# Patient Record
Sex: Male | Born: 1985 | Race: White | Hispanic: No
Health system: Southern US, Academic
[De-identification: ages and names within clinical notes are randomized; demographics above are authoritative.]

## PROBLEM LIST (undated history)

## (undated) ENCOUNTER — Telehealth

## (undated) ENCOUNTER — Encounter

## (undated) ENCOUNTER — Ambulatory Visit: Payer: PRIVATE HEALTH INSURANCE

## (undated) ENCOUNTER — Ambulatory Visit: Payer: BLUE CROSS/BLUE SHIELD

## (undated) ENCOUNTER — Encounter: Attending: Rheumatology | Primary: Rheumatology

## (undated) DIAGNOSIS — E7800 Pure hypercholesterolemia, unspecified: Secondary | ICD-10-CM

## (undated) DIAGNOSIS — I10 Essential (primary) hypertension: Secondary | ICD-10-CM

## (undated) DIAGNOSIS — F419 Anxiety disorder, unspecified: Secondary | ICD-10-CM

## (undated) HISTORY — PX: NASAL SINUS SURGERY: SHX719

## (undated) HISTORY — PX: WISDOM TOOTH EXTRACTION: SHX21

---
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
## 2016-01-26 ENCOUNTER — Ambulatory Visit
Admission: EM | Admit: 2016-01-26 | Discharge: 2016-01-26 | Disposition: A | Discharge status: Home / Self Care | Payer: BLUE CROSS/BLUE SHIELD | Attending: Family Medicine | Admitting: Family Medicine

## 2016-01-26 ENCOUNTER — Encounter: Payer: Self-pay | Admitting: Emergency Medicine

## 2016-01-26 DIAGNOSIS — L0390 Cellulitis, unspecified: Secondary | ICD-10-CM

## 2016-01-26 DIAGNOSIS — Z872 Personal history of diseases of the skin and subcutaneous tissue: Secondary | ICD-10-CM | POA: Diagnosis not present

## 2016-01-26 DIAGNOSIS — L0291 Cutaneous abscess, unspecified: Secondary | ICD-10-CM | POA: Diagnosis not present

## 2016-01-26 HISTORY — DX: Pure hypercholesterolemia, unspecified: E78.00

## 2016-01-26 HISTORY — DX: Anxiety disorder, unspecified: F41.9

## 2016-01-26 HISTORY — DX: Essential (primary) hypertension: I10

## 2016-01-26 MED ORDER — HYDROCODONE-ACETAMINOPHEN 5-325 MG PO TABS: 1.0000 | ORAL_TABLET | Freq: Four times a day (QID) | ORAL | Status: DC | PRN

## 2016-01-26 MED ORDER — MUPIROCIN 2 % EX OINT: 1.0000 "application " | TOPICAL_OINTMENT | Freq: Three times a day (TID) | CUTANEOUS | Status: DC

## 2016-01-26 MED ORDER — DOXYCYCLINE HYCLATE 100 MG PO CAPS: 100.0000 mg | ORAL_CAPSULE | Freq: Two times a day (BID) | ORAL | Status: DC

## 2016-01-26 NOTE — Discharge Instructions (Signed)
Abscess  An abscess is an infected area that contains a collection of pus and debris.It can occur in almost any part of the body. An abscess is also known as a furuncle or boil.  CAUSES   An abscess occurs when tissue gets infected. This can occur from blockage of oil or sweat glands, infection of hair follicles, or a minor injury to the skin. As the body tries to fight the infection, pus collects in the area and creates pressure under the skin. This pressure causes pain. People with weakened immune systems have difficulty fighting infections and get certain abscesses more often.   SYMPTOMS  Usually an abscess develops on the skin and becomes a painful mass that is red, warm, and tender. If the abscess forms under the skin, you may feel a moveable soft area under the skin. Some abscesses break open (rupture) on their own, but most will continue to get worse without care. The infection can spread deeper into the body and eventually into the bloodstream, causing you to feel ill.   DIAGNOSIS   Your caregiver will take your medical history and perform a physical exam. A sample of fluid may also be taken from the abscess to determine what is causing your infection.  TREATMENT   Your caregiver may prescribe antibiotic medicines to fight the infection. However, taking antibiotics alone usually does not cure an abscess. Your caregiver may need to make a small cut (incision) in the abscess to drain the pus. In some cases, gauze is packed into the abscess to reduce pain and to continue draining the area.  HOME CARE INSTRUCTIONS    Only take over-the-counter or prescription medicines for pain, discomfort, or fever as directed by your caregiver.   If you were prescribed antibiotics, take them as directed. Finish them even if you start to feel better.   If gauze is used, follow your caregiver's directions for changing the gauze.   To avoid spreading the infection:    Keep your draining abscess covered with a bandage.     Wash your hands well.    Do not share personal care items, towels, or whirlpools with others.    Avoid skin contact with others.   Keep your skin and clothes clean around the abscess.   Keep all follow-up appointments as directed by your caregiver.  SEEK MEDICAL CARE IF:    You have increased pain, swelling, redness, fluid drainage, or bleeding.   You have muscle aches, chills, or a general ill feeling.   You have a fever.  MAKE SURE YOU:    Understand these instructions.   Will watch your condition.   Will get help right away if you are not doing well or get worse.     This information is not intended to replace advice given to you by your health care provider. Make sure you discuss any questions you have with your health care provider.     Document Released: 04/17/2005 Document Revised: 01/07/2012 Document Reviewed: 09/20/2011  Elsevier Interactive Patient Education 2016 Elsevier Inc.  Hidradenitis Suppurativa  Hidradenitis suppurativa is a long-term (chronic) skin disease that starts with blocked sweat glands or hair follicles. Bacteria may grow in these blocked openings of your skin. Hidradenitis suppurativa is like a severe form of acne that develops in areas of your body where acne would be unusual. It is most likely to affect the areas of your body where skin rubs against skin and becomes moist. This includes your:   Underarms.     Groin.   Genital areas.   Buttocks.   Upper thighs.   Breasts.  Hidradenitis suppurativa may start out with small pimples. The pimples can develop into deep sores that break open (rupture) and drain pus. Over time your skin may thicken and become scarred. Hidradenitis suppurativa cannot be passed from person to person.   CAUSES   The exact cause of hidradenitis suppurativa is not known. This condition may be due to:   Male and male hormones. The condition is rare before and after puberty.   An overactive body defense system (immune system). Your immune system may  overreact to the blocked hair follicles or sweat glands and cause swelling and pus-filled sores.  RISK FACTORS  You may have a higher risk of hidradenitis suppurativa if you:   Are a woman.   Are between ages 11 and 55.   Have a family history of hidradenitis suppurativa.   Have a personal history of acne.   Are overweight.   Smoke.   Take the drug lithium.  SIGNS AND SYMPTOMS   The first signs of an outbreak are usually painful skin bumps that look like pimples. As the condition progresses:   Skin bumps may get bigger and grow deeper into the skin.   Bumps under the skin may rupture and drain smelly pus.   Skin may become itchy and infected.   Skin may thicken and scar.   Drainage may continue through tunnels under the skin (fistulas).   Walking and moving your arms can become painful.  DIAGNOSIS   Your health care provider may diagnose hidradenitis suppurativa based on your medical history and your signs and symptoms. A physical exam will also be done. You may need to see a health care provider who specializes in skin diseases (dermatologist). You may also have tests done to confirm the diagnosis. These can include:   Swabbing a sample of pus or drainage from your skin so it can be sent to the lab and tested for infection.   Blood tests to check for infection.  TREATMENT   The same treatment will not work for everybody with hidradenitis suppurativa. Your treatment will depend on how severe your symptoms are. You may need to try several treatments to find what works best for you. Part of your treatment may include cleaning and bandaging (dressing) your wounds. You may also have to take medicines, such as the following:   Antibiotics.   Acne medicines.   Medicines to block or suppress the immune system.   A diabetes medicine (metformin) is sometimes used to treat this condition.   For women, birth control pills can sometimes help relieve symptoms.  You may need surgery if you have a severe case  of hidradenitis suppurativa that does not respond to medicine. Surgery may involve:    Using a laser to clear the skin and remove hair follicles.   Opening and draining deep sores.   Removing the areas of skin that are diseased and scarred.  HOME CARE INSTRUCTIONS   Learn as much as you can about your disease, and work closely with your health care providers.   Take medicines only as directed by your health care provider.   If you were prescribed an antibiotic medicine, finish it all even if you start to feel better.   If you are overweight, losing weight may be very helpful. Try to reach and maintain a healthy weight.   Do not use any tobacco products, including cigarettes, chewing tobacco, or   electronic cigarettes. If you need help quitting, ask your health care provider.   Do not shave the areas where you get hidradenitis suppurativa.   Do not wear deodorant.   Wear loose-fitting clothes.   Try not to overheat and get sweaty.   Take a daily bleach bath as directed by your health care provider.   Fill your bathtub halfway with water.   Pour in  cup of unscented household bleach.   Soak for 5-10 minutes.   Cover sore areas with a warm, clean washcloth (compress) for 5-10 minutes.  SEEK MEDICAL CARE IF:    You have a flare-up of hidradenitis suppurativa.   You have chills or a fever.   You are having trouble controlling your symptoms at home.     This information is not intended to replace advice given to you by your health care provider. Make sure you discuss any questions you have with your health care provider.     Document Released: 02/20/2004 Document Revised: 07/29/2014 Document Reviewed: 10/08/2013  Elsevier Interactive Patient Education 2016 Elsevier Inc.

## 2016-01-26 NOTE — ED Notes (Signed)
Patient c/o abscess on his right thigh for the past 3 days.  Patient reports some drainage from the site.  Patient denies fever.

## 2016-01-26 NOTE — ED Provider Notes (Signed)
CSN: [id number]     Arrival date & time 01/26/16  1714 History   First MD Initiated Contact with Patient 01/26/16 1753     Chief Complaint  Patient presents with  . Abscess   (Consider location/radiation/quality/duration/timing/severity/associated sxs/prior Treatment) HPI  This a 30 year old male who is visiting relatives here from [place]. He states he has a history of hidradenitis suppurativa and for the last 3 days has noticed a beginning of an abscess on his right medial thigh. Says some clear to purulent drainage from the site the last few days. He has had no fever or chills but his temperature is 99.4 today. He's been treated in the past with numerous antibiotics prednisone and I&D.     Past Medical History  Diagnosis Date  . Hypertension   . Anxiety   . Hypercholesteremia    Past Surgical History  Procedure Laterality Date  . Wisdom tooth extraction     Family History  Problem Relation Age of Onset  . Hypertension Mother   . Hyperlipidemia Mother   . Anxiety disorder Mother   . Hypertension Father   . Hyperlipidemia Father   . Anxiety disorder Father    Social History  Substance Use Topics  . Smoking status: Current Every Day Smoker    Types: Cigarettes  . Smokeless tobacco: None  . Alcohol Use: No    Review of Systems  Constitutional: Positive for activity change. Negative for fever, chills and fatigue.  Skin: Positive for rash.  All other systems reviewed and are negative.   Allergies  Sulfa antibiotics  Home Medications   Prior to Admission medications   Medication Sig Start Date End Date Taking? Authorizing Provider  DULoxetine (CYMBALTA) 20 MG capsule Take 20 mg by mouth daily.   Yes Historical Provider, MD  fenofibrate 160 MG tablet Take 160 mg by mouth daily.   Yes Historical Provider, MD  lisinopril (PRINIVIL,ZESTRIL) 30 MG tablet Take 30 mg by mouth daily.   Yes Historical Provider, MD  rosuvastatin (CRESTOR) 10 MG tablet Take 10 mg by mouth daily.    Yes Historical Provider, MD  doxycycline (VIBRAMYCIN) 100 MG capsule Take 1 capsule (100 mg total) by mouth 2 (two) times daily. 01/26/16   Lutricia FeilWilliam P Neetu Carrozza, PA-C  HYDROcodone-acetaminophen (NORCO/VICODIN) 5-325 MG tablet Take 1 tablet by mouth every 6 (six) hours as needed for severe pain. 01/26/16   Lutricia FeilWilliam P Jazzalynn Rhudy, PA-C  mupirocin ointment (BACTROBAN) 2 % Apply 1 application topically 3 (three) times daily. 01/26/16   Lutricia FeilWilliam P Terryl Molinelli, PA-C   Meds Ordered and Administered this Visit  Medications - No data to display  BP 129/65 mmHg  Pulse 106  Temp(Src) 99.4 F (37.4 C) (Tympanic)  Resp 16  Ht 5\' 10"  (1.778 m)  Wt 240 lb (108.863 kg)  BMI 34.44 kg/m2  SpO2 100% No data found.   Physical Exam  Constitutional: He is oriented to person, place, and time. He appears well-developed and well-nourished. No distress.  HENT:  Head: Normocephalic and atraumatic.  Eyes: Conjunctivae are normal. Pupils are equal, round, and reactive to light.  Neck: Normal range of motion. Neck supple.  Musculoskeletal: Normal range of motion. He exhibits tenderness. He exhibits no edema.  Neurological: He is alert and oriented to person, place, and time.  Skin: Skin is warm and dry. He is not diaphoretic. There is erythema.  Examination of the right proximal medial thigh reveals areas of previous hydradenitis suppurativa. There is one area that is currently draining clear fluid  with significant induration extending approximately 2 and half centimeters in diameter from the center portion. There is no fluctuance present. There is mild erythema present that is blanchable.  Psychiatric: He has a normal mood and affect. His behavior is normal. Judgment and thought content normal.  Nursing note and vitals reviewed.   ED Course  Procedures (including critical care time)  Labs Review Labs Reviewed - No data to display  Imaging Review No results found.   Visual Acuity Review  Right Eye Distance:   Left Eye  Distance:   Bilateral Distance:    Right Eye Near:   Left Eye Near:    Bilateral Near:         MDM   1. Abscess and cellulitis   2. History of hidradenitis suppurativa    Discharge Medication List as of 01/26/2016  6:10 PM    START taking these medications   Details  doxycycline (VIBRAMYCIN) 100 MG capsule Take 1 capsule (100 mg total) by mouth 2 (two) times daily., Starting 01/26/2016, Until Discontinued, Normal    HYDROcodone-acetaminophen (NORCO/VICODIN) 5-325 MG tablet Take 1 tablet by mouth every 6 (six) hours as needed for severe pain., Starting 01/26/2016, Until Discontinued, Print    mupirocin ointment (BACTROBAN) 2 % Apply 1 application topically 3 (three) times daily., Starting 01/26/2016, Until Discontinued, Normal      Plan: 1. Test/x-ray results and diagnosis reviewed with patient 2. rx as per orders; risks, benefits, potential side effects reviewed with patient 3. Recommend supportive treatment with hot Compresses 3-4 times a day  lasting 10 minutes at a time. I have instructed him to dry the area and apply Bactroban ointment to all areas of induration. We'll start him on doxycycline since he is allergic to sulfa drugs for 5 days of therapy. He should notice improvement. If the area begins to "point" he will return to the clinic for possible I&D. Otherwise he should follow-up with his primary care in [place] when he turned returns up there. 4. F/u prn if symptoms worsen or don't improve     Lutricia FeilWilliam P Anysha Frappier, PA-C 01/26/16 1821

## 2017-01-02 ENCOUNTER — Ambulatory Visit
Admission: EM | Admit: 2017-01-02 | Discharge: 2017-01-02 | Disposition: A | Discharge status: Home / Self Care | Payer: 59 | Attending: Emergency Medicine | Admitting: Emergency Medicine

## 2017-01-02 ENCOUNTER — Encounter: Payer: Self-pay | Admitting: *Deleted

## 2017-01-02 DIAGNOSIS — L732 Hidradenitis suppurativa: Secondary | ICD-10-CM

## 2017-01-02 MED ORDER — PREDNISONE 10 MG (21) PO TBPK: ORAL_TABLET | ORAL | 0 refills | Status: DC

## 2017-01-02 MED ORDER — CLINDAMYCIN HCL 300 MG PO CAPS: 300.0000 mg | ORAL_CAPSULE | Freq: Four times a day (QID) | ORAL | 0 refills | Status: AC

## 2017-01-02 MED ORDER — IBUPROFEN 600 MG PO TABS: 600.0000 mg | ORAL_TABLET | Freq: Four times a day (QID) | ORAL | 0 refills | Status: DC | PRN

## 2017-01-02 MED ORDER — HYDROCODONE-ACETAMINOPHEN 5-325 MG PO TABS: 1.0000 | ORAL_TABLET | Freq: Four times a day (QID) | ORAL | 0 refills | Status: DC | PRN

## 2017-01-02 NOTE — ED Triage Notes (Signed)
Abcess to left groin. Pt states site opened and drained 2 days ago but now is closed and getting larger.

## 2017-01-02 NOTE — Discharge Instructions (Signed)
Here is a list of primary care providers who are taking new patients:  Dr. Elizabeth Sauereanna Jones, Dr. Schuyler AmorWilliam Plonk [address] [place] [address] [phone]  Dr. Everlene OtherJayce Cook [address]  [address]  [place] [address]  [phone]  [facility] [place] [address] [place] [address]  [place] [address]  [phone]  [facility] [address]  [place], [place] [phone]  [facility] [place] [address]  [phone] [place], [place] [address]  Go to www.goodrx.com to look up your medications. This will give you a list of where you can find your prescriptions at the most affordable prices. Or ask the pharmacist what the cash price is. This can be less expensive than what you would pay with insurance.

## 2017-01-02 NOTE — ED Provider Notes (Signed)
HPI  SUBJECTIVE:  Jerry Duke is a 31 y.o. male who presents with a painful mass of gradually increasing size on his left inner thigh starting 4 days ago. He reports the pain is sharp, constant. He states that it opened and started spontaneously draining milky yellow pus 2 nights ago but states that his symptoms closed up. He states it is now hard and red. He states that the redness and hardness was getting better after draining but states it is starting to get worse again. He has been doing warm compresses, hot showers, ibuprofen 400 mg twice a day with some improvement in symptoms. Symptoms are worse with walking, sitting, standing, palpation. No recent antibiotics, steroids, antipyretic in the past 6-8 hours. Also reports an abscess in his axilla that has been draining spontaneously for the past 2 or 3 weeks. No surrounding erythema on this area. No body aches, fevers, chills, contacts with MRSA. He has been treated for this multiple times in the past with multiple courses of antibiotics and steroids and states that clindamycin and prednisone work well for him. He states this is identical to previous  hidradenitis suppurativa flares. He states that he has had multiple cultures that have been sensitive to clindamycin and Cipro. No history of MRSA. No history of diabetes, prosthetic heart valves or prosthetic joints. PMD: None. States that he moved here 3-1/2 weeks ago.   Past Medical History:  Diagnosis Date  . Anxiety   . Hypercholesteremia   . Hypertension     Past Surgical History:  Procedure Laterality Date  . WISDOM TOOTH EXTRACTION      Family History  Problem Relation Age of Onset  . Hypertension Mother   . Hyperlipidemia Mother   . Anxiety disorder Mother   . Hypertension Father   . Hyperlipidemia Father   . Anxiety disorder Father     Social History  Substance Use Topics  . Smoking status: Current Every Day Smoker    Types: Cigarettes  . Smokeless tobacco: Never Used  .  Alcohol use No    No current facility-administered medications for this encounter.   Current Outpatient Prescriptions:  .  DULoxetine (CYMBALTA) 20 MG capsule, Take 20 mg by mouth daily., Disp: , Rfl:  .  fenofibrate 160 MG tablet, Take 160 mg by mouth daily., Disp: , Rfl:  .  lisinopril (PRINIVIL,ZESTRIL) 30 MG tablet, Take 30 mg by mouth daily., Disp: , Rfl:  .  pregabalin (LYRICA) 150 MG capsule, Take 150 mg by mouth 2 (two) times daily., Disp: , Rfl:  .  rosuvastatin (CRESTOR) 10 MG tablet, Take 10 mg by mouth daily., Disp: , Rfl:  .  clindamycin (CLEOCIN) 300 MG capsule, Take 1 capsule (300 mg total) by mouth 4 (four) times daily., Disp: 40 capsule, Rfl: 0 .  HYDROcodone-acetaminophen (NORCO/VICODIN) 5-325 MG tablet, Take 1-2 tablets by mouth every 6 (six) hours as needed for severe pain., Disp: 15 tablet, Rfl: 0 .  ibuprofen (ADVIL,MOTRIN) 600 MG tablet, Take 1 tablet (600 mg total) by mouth every 6 (six) hours as needed., Disp: 30 tablet, Rfl: 0 .  mupirocin ointment (BACTROBAN) 2 %, Apply 1 application topically 3 (three) times daily., Disp: 22 g, Rfl: 0 .  predniSONE (STERAPRED UNI-PAK 21 TAB) 10 MG (21) TBPK tablet, Dispense one 6 day pack. Take as directed with food., Disp: 21 tablet, Rfl: 0  Allergies  Allergen Reactions  . Sulfa Antibiotics Anaphylaxis     ROS  As noted in HPI.   Physical  Exam  BP 137/79 (BP Location: Left Arm)   Pulse (!) 107   Temp 98.3 F (36.8 C) (Oral)   Resp 19   Ht 5\' 10"  (1.778 m)   Wt 220 lb (99.8 kg)   SpO2 100%   BMI 31.57 kg/m   Constitutional: Well developed, well nourished, no acute distress Eyes:  EOMI, conjunctiva normal bilaterally HENT: Normocephalic, atraumatic,mucus membranes moist Respiratory: Normal inspiratory effort Cardiovascular: Normal rate GI: nondistended skin: 1.5 x 2.5 cm tender area of erythema with no central fluctuance in the left medial groin area. Positive central pointing. Marked this with a marker for  reference. multiple lesions consistent with hidradenitis suppurativa    Musculoskeletal: no deformities Neurologic: Alert & oriented x 3, no focal neuro deficits Psychiatric: Speech and behavior appropriate   ED Course   Medications - No data to display  No orders of the defined types were placed in this encounter.   No results found for this or any previous visit (from the past 24 hour(s)). No results found.  ED Clinical Impression  Hidradenitis suppurativa   ED Assessment/Plan  Previous records reveiwed. Patient was seen here last year for similar sx, found to have abscess/cellulitis with hidradenitis suppurativa, sent home with doxycycline, Bactroban and Norco.  Offered to do an I&D, patient states that this is rarely helpful/ successful and they rarely get material out of this. Presentation most consistent with hiradentiis suppurativa exacerbation. Patient states that clindamycin works well for him and he has been treated successfully in the past with prednisone. As he has multiple lesions, including his left axilla that has been present for 2 or 3 weeks, We'll send home with 10 days of clindamycin and a 60 mg prednisone taper. Also home with ibuprofen 600 mg with 1 g of Tylenol, short course of Norco. [employer] [place] narcotic [place] reviewed. No opiate prescriptions in the past 6 months.    also provide a primary care referral and and dermatology referral. Pt to return here for I&D if sx get worse.  Discussed MDM, plan and followup with patient. Discussed sn/sx that should prompt return to the ED. Patient agrees with plan.   Meds ordered this encounter  Medications  . pregabalin (LYRICA) 150 MG capsule    Sig: Take 150 mg by mouth 2 (two) times daily.  Marland Kitchen. HYDROcodone-acetaminophen (NORCO/VICODIN) 5-325 MG tablet    Sig: Take 1-2 tablets by mouth every 6 (six) hours as needed for severe pain.    Dispense:  15 tablet    Refill:  0  . ibuprofen (ADVIL,MOTRIN) 600 MG  tablet    Sig: Take 1 tablet (600 mg total) by mouth every 6 (six) hours as needed.    Dispense:  30 tablet    Refill:  0  . clindamycin (CLEOCIN) 300 MG capsule    Sig: Take 1 capsule (300 mg total) by mouth 4 (four) times daily.    Dispense:  40 capsule    Refill:  0  . predniSONE (STERAPRED UNI-PAK 21 TAB) 10 MG (21) TBPK tablet    Sig: Dispense one 6 day pack. Take as directed with food.    Dispense:  21 tablet    Refill:  0    *This clinic note was created using Scientist, clinical (histocompatibility and immunogenetics)Dragon dictation software. Therefore, there may be occasional mistakes despite careful proofreading.  ?   Domenick GongMortenson, Jerick Khachatryan, MD 01/02/17 1749

## 2017-02-02 ENCOUNTER — Ambulatory Visit
Admission: EM | Admit: 2017-02-02 | Discharge: 2017-02-02 | Disposition: A | Discharge status: Home / Self Care | Payer: 59 | Attending: Family Medicine | Admitting: Family Medicine

## 2017-02-02 DIAGNOSIS — L732 Hidradenitis suppurativa: Secondary | ICD-10-CM | POA: Diagnosis not present

## 2017-02-02 MED ORDER — HYDROCODONE-ACETAMINOPHEN 5-325 MG PO TABS: ORAL_TABLET | ORAL | 0 refills | Status: DC

## 2017-02-02 MED ORDER — PREDNISONE 10 MG (21) PO TBPK: ORAL_TABLET | ORAL | 0 refills | Status: DC

## 2017-02-02 MED ORDER — CLINDAMYCIN HCL 300 MG PO CAPS: 300.0000 mg | ORAL_CAPSULE | Freq: Four times a day (QID) | ORAL | 0 refills | Status: DC

## 2017-02-02 MED ORDER — IBUPROFEN 800 MG PO TABS: 800.0000 mg | ORAL_TABLET | Freq: Three times a day (TID) | ORAL | 0 refills | Status: DC | PRN

## 2017-02-02 NOTE — ED Provider Notes (Signed)
[facility]    CSN: [id number] Arrival date & time: 02/02/17  1528     History   Chief Complaint Chief Complaint  Patient presents with  . Recurrent Skin Infections    HPI Jerry Duke is a 31 y.o. male.   31 yo male with a c/o exacerbation/flare up of hidradenitis suppurativa. Patient has a chronic history of this. States he's had attempts of I&D of these lesions without drainage. He states that he refuses I&D. States that clindamycin and prednisone help. Also states that he is in the process of setting up an appointment with a specialist at [facility]   The history is provided by the patient.    Past Medical History:  Diagnosis Date  . Anxiety   . Hypercholesteremia   . Hypertension     There are no active problems to display for this patient.   Past Surgical History:  Procedure Laterality Date  . WISDOM TOOTH EXTRACTION         Home Medications    Prior to Admission medications   Medication Sig Start Date End Date Taking? Authorizing Provider  DULoxetine (CYMBALTA) 20 MG capsule Take 20 mg by mouth daily.   Yes [provider]  fenofibrate 160 MG tablet Take 160 mg by mouth daily.   Yes [provider]  lisinopril (PRINIVIL,ZESTRIL) 30 MG tablet Take 30 mg by mouth daily.   Yes [provider]  pregabalin (LYRICA) 150 MG capsule Take 150 mg by mouth 2 (two) times daily.   Yes [provider]  rosuvastatin (CRESTOR) 10 MG tablet Take 10 mg by mouth daily.   Yes [provider]  clindamycin (CLEOCIN) 300 MG capsule Take 1 capsule (300 mg total) by mouth 4 (four) times daily. 02/02/17   Payton Mccallum, MD  HYDROcodone-acetaminophen (NORCO/VICODIN) 5-325 MG tablet 1-2 tabs po qd prn 02/02/17   Payton Mccallum, MD  ibuprofen (ADVIL,MOTRIN) 800 MG tablet Take 1 tablet (800 mg total) by mouth every 8 (eight) hours as needed. 02/02/17   Payton Mccallum, MD  mupirocin ointment (BACTROBAN) 2 % Apply 1 application topically 3  (three) times daily. 01/26/16   Lutricia Feil, PA-C  predniSONE (STERAPRED UNI-PAK 21 TAB) 10 MG (21) TBPK tablet Dispense one 6 day pack. Take as directed with food. 02/02/17   Payton Mccallum, MD    Family History Family History  Problem Relation Age of Onset  . Hypertension Mother   . Hyperlipidemia Mother   . Anxiety disorder Mother   . Hypertension Father   . Hyperlipidemia Father   . Anxiety disorder Father     Social History Social History  Substance Use Topics  . Smoking status: Current Every Day Smoker    Types: Cigarettes  . Smokeless tobacco: Never Used  . Alcohol use No     Allergies   Sulfa antibiotics   Review of Systems Review of Systems   Physical Exam Triage Vital Signs ED Triage Vitals  Enc Vitals Group     BP 02/02/17 1542 (!) 137/91     Pulse Rate 02/02/17 1542 (!) 111     Resp 02/02/17 1542 18     Temp 02/02/17 1542 98.6 F (37 C)     Temp Source 02/02/17 1542 Oral     SpO2 02/02/17 1542 98 %     Weight --      Height --      Head Circumference --      Peak Flow --  Pain Score 02/02/17 1545 8     Pain Loc --      Pain Edu? --      Excl. in GC? --    No data found.   Updated Vital Signs BP (!) 137/91 (BP Location: Left Arm)   Pulse (!) 111   Temp 98.6 F (37 C) (Oral)   Resp 18   SpO2 98%   Visual Acuity Right Eye Distance:   Left Eye Distance:   Bilateral Distance:    Right Eye Near:   Left Eye Near:    Bilateral Near:     Physical Exam  Constitutional: He appears well-developed and well-nourished. No distress.  Skin: He is not diaphoretic. There is erythema.  Multiple pinpoint tender lesions on axilla and inner thigh consistent with hidradenitis  Nursing note and vitals reviewed.    UC Treatments / Results  Labs (all labs ordered are listed, but only abnormal results are displayed) Labs Reviewed - No data to display  EKG  EKG Interpretation None       Radiology No results  found.  Procedures Procedures (including critical care time)  Medications Ordered in UC Medications - No data to display   Initial Impression / Assessment and Plan / UC Course  I have reviewed the triage vital signs and the nursing notes.  Pertinent labs & imaging results that were available during my care of the patient were reviewed by me and considered in my medical decision making (see chart for details).       Final Clinical Impressions(s) / UC Diagnoses   Final diagnoses:  Hidradenitis suppurativa    New Prescriptions Discharge Medication List as of 02/02/2017  4:11 PM    START taking these medications   Details  clindamycin (CLEOCIN) 300 MG capsule Take 1 capsule (300 mg total) by mouth 4 (four) times daily., Starting Sun 02/02/2017, Normal       1. diagnosis reviewed with patient 2. rx as per orders above; reviewed possible side effects, interactions, risks and benefits; rx for clindamycin, prednisone  3. Recommend supportive treatment with warm compresses to area 4. Follow-up prn if symptoms worsen or don't improve   Payton Mccallumonty, Crystallee Werden, MD 02/02/17 1624

## 2017-02-02 NOTE — ED Triage Notes (Signed)
Pt has hydradenitis suppurative and said he has recurrent boils. He has 1 under his left armpit and 2 on his left thigh and wanted a rx for antibiotics. No fever. But pt does state he isn't feeling well. They first appeared on Thursday. Does not want them drained at all!

## 2017-02-17 ENCOUNTER — Ambulatory Visit (INDEPENDENT_AMBULATORY_CARE_PROVIDER_SITE_OTHER): Payer: 59

## 2017-02-17 ENCOUNTER — Ambulatory Visit
Admission: EM | Admit: 2017-02-17 | Discharge: 2017-02-17 | Disposition: A | Discharge status: Home / Self Care | Payer: 59 | Attending: Family Medicine | Admitting: Family Medicine

## 2017-02-17 ENCOUNTER — Encounter: Payer: Self-pay | Admitting: Emergency Medicine

## 2017-02-17 DIAGNOSIS — W19XXXA Unspecified fall, initial encounter: Secondary | ICD-10-CM

## 2017-02-17 DIAGNOSIS — M545 Low back pain: Secondary | ICD-10-CM | POA: Diagnosis not present

## 2017-02-17 DIAGNOSIS — S20229A Contusion of unspecified back wall of thorax, initial encounter: Secondary | ICD-10-CM

## 2017-02-17 MED ORDER — IBUPROFEN 600 MG PO TABS: 600.0000 mg | ORAL_TABLET | Freq: Three times a day (TID) | ORAL | 0 refills | Status: AC | PRN

## 2017-02-17 MED ORDER — HYDROCODONE-ACETAMINOPHEN 5-325 MG PO TABS: ORAL_TABLET | ORAL | 0 refills | Status: DC

## 2017-02-17 MED ORDER — CYCLOBENZAPRINE HCL 10 MG PO TABS: 10.0000 mg | ORAL_TABLET | Freq: Every day | ORAL | 0 refills | Status: DC

## 2017-02-17 NOTE — ED Triage Notes (Signed)
Patient states that he slipped on his front porch steps this morning and fell.  Patient c/o pain in his back.

## 2017-02-17 NOTE — Discharge Instructions (Signed)
Rest, ice

## 2017-02-17 NOTE — ED Provider Notes (Signed)
[facility]    CSN: [id number] Arrival date & time: 02/17/17  [id number]     History   Chief Complaint Chief Complaint  Patient presents with  . Fall  . Back Pain    HPI Jerry Duke is a 31 y.o. male.   31 yo male with a c/o mid and low back pain after slipping on his front porch this morning and falling, hitting his mid and lower back on the steps. Denies numbness/tingling, hitting his head, loss of consciousness.    The history is provided by the patient.  Fall   Back Pain    Past Medical History:  Diagnosis Date  . Anxiety   . Hypercholesteremia   . Hypertension     There are no active problems to display for this patient.   Past Surgical History:  Procedure Laterality Date  . NASAL SINUS SURGERY    . WISDOM TOOTH EXTRACTION         Home Medications    Prior to Admission medications   Medication Sig Start Date End Date Taking? Authorizing Provider  cetirizine (ZYRTEC) 10 MG tablet Take 10 mg by mouth daily.   Yes [provider]  fluticasone (FLONASE) 50 MCG/ACT nasal spray Place 1 spray into both nostrils daily.   Yes [provider]  cyclobenzaprine (FLEXERIL) 10 MG tablet Take 1 tablet (10 mg total) by mouth at bedtime. 02/17/17   Payton Mccallumonty, Candelario Steppe, MD  DULoxetine (CYMBALTA) 20 MG capsule Take 20 mg by mouth daily.    [provider]  fenofibrate 160 MG tablet Take 160 mg by mouth daily.    [provider]  HYDROcodone-acetaminophen (NORCO/VICODIN) 5-325 MG tablet 1-2 tabs po qd prn 02/17/17   Payton Mccallumonty, Haliey Romberg, MD  ibuprofen (ADVIL,MOTRIN) 600 MG tablet Take 1 tablet (600 mg total) by mouth every 8 (eight) hours as needed. 02/17/17   Payton Mccallumonty, Lorrie Gargan, MD  lisinopril (PRINIVIL,ZESTRIL) 30 MG tablet Take 30 mg by mouth daily.    [provider]  pregabalin (LYRICA) 150 MG capsule Take 150 mg by mouth 2 (two) times daily.    [provider]  rosuvastatin (CRESTOR) 10 MG tablet Take 10 mg by mouth daily.     [provider]    Family History Family History  Problem Relation Age of Onset  . Hypertension Mother   . Hyperlipidemia Mother   . Anxiety disorder Mother   . Hypertension Father   . Hyperlipidemia Father   . Anxiety disorder Father     Social History Social History  Substance Use Topics  . Smoking status: Current Every Day Smoker    Types: Cigarettes  . Smokeless tobacco: Never Used  . Alcohol use No     Allergies   Sulfa antibiotics   Review of Systems Review of Systems  Musculoskeletal: Positive for back pain.     Physical Exam Triage Vital Signs ED Triage Vitals  Enc Vitals Group     BP 02/17/17 0847 (!) 141/87     Pulse Rate 02/17/17 0847 86     Resp 02/17/17 0847 16     Temp 02/17/17 0847 98.5 F (36.9 C)     Temp Source 02/17/17 0847 Oral     SpO2 02/17/17 0847 100 %     Weight 02/17/17 0845 240 lb (108.9 kg)     Height 02/17/17 0845 5\' 10"  (1.778 m)     Head Circumference --      Peak Flow --      Pain  Score 02/17/17 0845 9     Pain Loc --      Pain Edu? --      Excl. in GC? --    No data found.   Updated Vital Signs BP (!) 141/87 (BP Location: Left Arm)   Pulse 86   Temp 98.5 F (36.9 C) (Oral)   Resp 16   Ht 5\' 10"  (1.778 m)   Wt 240 lb (108.9 kg)   SpO2 100%   BMI 34.44 kg/m   Visual Acuity Right Eye Distance:   Left Eye Distance:   Bilateral Distance:    Right Eye Near:   Left Eye Near:    Bilateral Near:     Physical Exam  Constitutional: He appears well-developed and well-nourished. No distress.  Cardiovascular: Normal rate.   Pulmonary/Chest: Effort normal and breath sounds normal. No respiratory distress. He has no wheezes. He has no rales.  Musculoskeletal:       Thoracic back: He exhibits tenderness, bony tenderness and swelling (and erythematous skin abrasion noted).       Lumbar back: He exhibits tenderness, bony tenderness and swelling (and erythematous skin abrasion noted).  Skin: He is not  diaphoretic.  Nursing note and vitals reviewed.    UC Treatments / Results  Labs (all labs ordered are listed, but only abnormal results are displayed) Labs Reviewed - No data to display  EKG  EKG Interpretation None       Radiology Dg Thoracic Spine 2 View  Result Date: 02/17/2017 CLINICAL DATA:  Status post fall down 5 DIS 6 steps today. The patient reports back pain. History of fibromyalgia. EXAM: THORACIC SPINE 2 VIEWS COMPARISON:  None in PACs FINDINGS: The thoracic vertebral bodies are preserved in height. The pedicles are intact. There are no abnormal paravertebral soft tissue densities. The disc space heights are well-maintained where visualized. IMPRESSION: There is no acute or significant chronic bony abnormality of the thoracic spine. Electronically Signed   By: David  [place]azil[place]Jordan M.D.   On: 02/17/2017 09:59   Dg Lumbar Spine Complete  Result Date: 02/17/2017 CLINICAL DATA:  Status post fall down 5 DIS 6 steps this morning. The patient is reporting thoracic and lumbar pain. History of fibromyalgia. EXAM: LUMBAR SPINE - COMPLETE 4+ VIEW COMPARISON:  None in PACs FINDINGS: The lumbar vertebral bodies are preserved in height. The pedicles and transverse processes are intact. There is no spondylolisthesis. The disc space heights are well maintained. The thoracolumbar junction is normal. IMPRESSION: There is no acute or significant chronic bony abnormality of the lumbar spine. Electronically Signed   By: David  [place] M.D.   On: 02/17/2017 10:00    Procedures Procedures (including critical care time)  Medications Ordered in UC Medications - No data to display   Initial Impression / Assessment and Plan / UC Course  I have reviewed the triage vital signs and the nursing notes.  Pertinent labs & imaging results that were available during my care of the patient were reviewed by me and considered in my medical decision making (see chart for details).       Final Clinical  Impressions(s) / UC Diagnoses   Final diagnoses:  Contusion of back, unspecified laterality, initial encounter    New Prescriptions Discharge Medication List as of 02/17/2017 10:10 AM    START taking these medications   Details  cyclobenzaprine (FLEXERIL) 10 MG tablet Take 1 tablet (10 mg total) by mouth at bedtime., Starting Mon 02/17/2017, Normal    HYDROcodone-acetaminophen (NORCO/VICODIN) 5-325  MG tablet 1-2 tabs po qd prn, Print    ibuprofen (ADVIL,MOTRIN) 600 MG tablet Take 1 tablet (600 mg total) by mouth every 8 (eight) hours as needed., Starting Mon 02/17/2017, Normal       1. x-ray results and diagnosis reviewed with patient 2. rx as per orders above; reviewed possible side effects, interactions, risks and benefits ([place] Controlled substance registry consulted) 3. Recommend supportive treatment with rest, ice, otc analgesics prn 4. Follow-up prn if symptoms worsen or don't improve   Payton Mccallum, MD 02/17/17 1546

## 2017-03-03 ENCOUNTER — Encounter: Payer: Self-pay | Admitting: Emergency Medicine

## 2017-03-03 ENCOUNTER — Ambulatory Visit
Admission: EM | Admit: 2017-03-03 | Discharge: 2017-03-03 | Disposition: A | Discharge status: Home / Self Care | Payer: 59 | Attending: Family Medicine | Admitting: Family Medicine

## 2017-03-03 DIAGNOSIS — L732 Hidradenitis suppurativa: Secondary | ICD-10-CM

## 2017-03-03 DIAGNOSIS — L0291 Cutaneous abscess, unspecified: Secondary | ICD-10-CM

## 2017-03-03 MED ORDER — DOXYCYCLINE HYCLATE 100 MG PO CAPS: 100.0000 mg | ORAL_CAPSULE | Freq: Two times a day (BID) | ORAL | 0 refills | Status: DC

## 2017-03-03 MED ORDER — HYDROCODONE-ACETAMINOPHEN 5-325 MG PO TABS: 1.0000 | ORAL_TABLET | Freq: Three times a day (TID) | ORAL | 0 refills | Status: DC | PRN

## 2017-03-03 MED ORDER — PREDNISONE 10 MG PO TABS: ORAL_TABLET | ORAL | 0 refills | Status: DC

## 2017-03-03 NOTE — ED Triage Notes (Signed)
Patient c/o abscesses under his arms, groins and back that started 3 days ago.

## 2017-03-03 NOTE — Discharge Instructions (Signed)
Take medication as prescribed. Rest. Drink plenty of fluids. Monitor closely.   Follow up with your primary care physician this week as needed. Return to Urgent care for new or worsening concerns.   

## 2017-03-03 NOTE — ED Provider Notes (Addendum)
[facility] ____________________________________________  Time seen: Approximately 8:06 PM  I have reviewed the triage vital signs and the nursing notes.   HISTORY  Chief Complaint Abscess   HPI Jerry Duke is a 31 y.o. male present for evaluation of multiple tender swollen areas have been present in the last 3 days gradual in onset. Reports chronic history of hidradenitis with recurrent abscesses in the same locations. Patient reports noticing these areas in the last 3 days. Patient reports that he has an appointment for hidradenitis specialist from [facility] this coming October. Patient reports last flareup was approximately one month ago was treated with prednisone taper and oral clindamycin. Reports sulfa allergy. Reports he has had sensitivities performed on abscesses previous with sensitivities to clindamycin as well as doxycycline, but states he started taking clindamycin more as he was recurrently on Doxy. Reports has been off of Doxy for approximately 1 year. Denies history of MRSA. Patient reports otherwise doing well. Denies fevers. Denies any drainage. States pain directly to the patient sites. Denies other pain. Reports continues to eat and drink well. Denies urinary or bowel difficulties changes. Denies any drainage or leakage.  Denies chest pain, shortness of breath, abdominal pain.  Denies recent sickness. Unsure of last tetanus immunization and declines update at this time. Denies insect bite or trauma.   Past Medical History:  Diagnosis Date  . Anxiety   . Hypercholesteremia   . Hypertension   hidradenitis supportiva   There are no active problems to display for this patient.   Past Surgical History:  Procedure Laterality Date  . NASAL SINUS SURGERY    . WISDOM TOOTH EXTRACTION       No current facility-administered medications for this encounter.   Current Outpatient Prescriptions:  .  cetirizine (ZYRTEC) 10 MG tablet, Take 10 mg by mouth daily.,  Disp: , Rfl:  .  cyclobenzaprine (FLEXERIL) 10 MG tablet, Take 1 tablet (10 mg total) by mouth at bedtime., Disp: 30 tablet, Rfl: 0 .  doxycycline (VIBRAMYCIN) 100 MG capsule, Take 1 capsule (100 mg total) by mouth 2 (two) times daily., Disp: 20 capsule, Rfl: 0 .  DULoxetine (CYMBALTA) 20 MG capsule, Take 20 mg by mouth daily., Disp: , Rfl:  .  fenofibrate 160 MG tablet, Take 160 mg by mouth daily., Disp: , Rfl:  .  fluticasone (FLONASE) 50 MCG/ACT nasal spray, Place 1 spray into both nostrils daily., Disp: , Rfl:  .  HYDROcodone-acetaminophen (NORCO/VICODIN) 5-325 MG tablet, Take 1 tablet by mouth every 8 (eight) hours as needed for moderate pain or severe pain (do not drive or operate machinery while taking as can cause drowsiness)., Disp: 6 tablet, Rfl: 0 .  ibuprofen (ADVIL,MOTRIN) 600 MG tablet, Take 1 tablet (600 mg total) by mouth every 8 (eight) hours as needed., Disp: 30 tablet, Rfl: 0 .  lisinopril (PRINIVIL,ZESTRIL) 30 MG tablet, Take 30 mg by mouth daily., Disp: , Rfl:  .  predniSONE (DELTASONE) 10 MG tablet, Start 60 mg po day one, then 50 mg po day two, taper by 10 mg daily until complete., Disp: 21 tablet, Rfl: 0 .  pregabalin (LYRICA) 150 MG capsule, Take 150 mg by mouth 2 (two) times daily., Disp: , Rfl:  .  rosuvastatin (CRESTOR) 10 MG tablet, Take 10 mg by mouth daily., Disp: , Rfl:   Allergies Sulfa antibiotics  Family History  Problem Relation Age of Onset  . Hypertension Mother   . Hyperlipidemia Mother   . Anxiety disorder Mother   .  Hypertension Father   . Hyperlipidemia Father   . Anxiety disorder Father     Social History Social History  Substance Use Topics  . Smoking status: Current Every Day Smoker    Types: Cigarettes  . Smokeless tobacco: Never Used  . Alcohol use No    Review of Systems Constitutional: No fever/chills Cardiovascular: Denies chest pain. Respiratory: Denies shortness of breath. Gastrointestinal: No abdominal pain.  No nausea, no  vomiting.  Genitourinary: Negative for dysuria. Musculoskeletal: Negative for back pain. Skin: as above.   ____________________________________________   PHYSICAL EXAM:  VITAL SIGNS: ED Triage Vitals  Enc Vitals Group     BP 03/03/17 1902 (!) 117/99     Pulse Rate 03/03/17 1902 (!) 108     Resp 03/03/17 1902 16     Temp 03/03/17 1902 98.5 F (36.9 C)     Temp Source 03/03/17 1902 Oral     SpO2 03/03/17 1902 100 %     Weight 03/03/17 1901 240 lb (108.9 kg)     Height 03/03/17 1901 5\' 10"  (1.778 m)     Head Circumference --      Peak Flow --      Pain Score 03/03/17 1901 7     Pain Loc --      Pain Edu? --      Excl. in GC? --     Constitutional: Alert and oriented. Well appearing and in no acute distress. Cardiovascular: Normal rate, regular rhythm. Grossly normal heart sounds.  Good peripheral circulation. Respiratory: Normal respiratory effort without tachypnea nor retractions. Breath sounds are clear and equal bilaterally. No wheezes, rales, rhonchi. Gastrointestinal: Soft and nontender.  Musculoskeletal: No midline cervical, thoracic or lumbar tenderness to palpation. Neurologic:  Normal speech and language.Speech is normal. No gait instability.  Skin:  Skin is warm, dry. Chaperone offered, patient declined.  Except: Right mid buttocks approximately 2 x 2 centimeter indurated erythematous area without fluctuance or pointing abscess, mild to moderate tenderness to palpation, no further surrounding erythema. Right inguinal crease 2 x 2 centimeter mildly erythematous and indurated area, mild tenderness palpation, no fluctuance or pointing abscess. Left axilla 1 x 2 cm mildly indurated area, non-erythematous, no pointing abscess, no fluctuance, minimally tenderness to direct palpation. No drainage. Psychiatric: Mood and affect are normal. Speech and behavior are normal. Patient exhibits appropriate insight and judgment   ___________________________________________    LABS (all labs ordered are listed, but only abnormal results are displayed)  Labs Reviewed - No data to display ____________________________________________  RADIOLOGY  No results found. ____________________________________________   PROCEDURES Procedures    INITIAL IMPRESSION / ASSESSMENT AND PLAN / ED COURSE  Pertinent labs & imaging results that were available during my care of the patient were reviewed by me and considered in my medical decision making (see chart for details).  Well-appearing patient. No acute distress. Chronic hidradenitis, stating he hasn't had a lot of recent traveling and states may have been a trigger as well as sweating for recent exacerbations. Reports responds well to oral antibiotics as well as tapering prednisone. Declines I&D. Awaiting specialty appointment. Was recently treated with oral clindamycin. Will treat patient with oral doxycycline and prednisone taper and quantity 8 Norco given. Encouraged rest, fluids, supportive care, warm compresses and close monitoring.   [place] controlled substance database reviewed  and most recent controlled medications documented norco # 8 on 02/17/17.  Discussed follow up with Primary care physician this week. Discussed follow up and return parameters including no  resolution or any worsening concerns. Patient verbalized understanding and agreed to plan.   ____________________________________________   FINAL CLINICAL IMPRESSION(S) / ED DIAGNOSES  Final diagnoses:  Hidradenitis suppurativa     Discharge Medication List as of 03/03/2017  8:04 PM    START taking these medications   Details  doxycycline (VIBRAMYCIN) 100 MG capsule Take 1 capsule (100 mg total) by mouth 2 (two) times daily., Starting Mon 03/03/2017, Normal    predniSONE (DELTASONE) 10 MG tablet Start 60 mg po day one, then 50 mg po day two, taper by 10 mg daily until complete., Normal        Note: This dictation was prepared with  Dragon dictation along with smaller phrase technology. Any transcriptional errors that result from this process are unintentional.         Renford DillsMiller, Fredi Hurtado, NP 03/03/17 2122    Renford DillsMiller, Jibril Mcminn, NP 03/03/17 2145

## 2017-04-16 ENCOUNTER — Encounter: Payer: Self-pay | Admitting: Emergency Medicine

## 2017-04-16 ENCOUNTER — Ambulatory Visit
Admission: EM | Admit: 2017-04-16 | Discharge: 2017-04-16 | Disposition: A | Discharge status: Home / Self Care | Payer: 59 | Attending: Family Medicine | Admitting: Family Medicine

## 2017-04-16 DIAGNOSIS — L732 Hidradenitis suppurativa: Secondary | ICD-10-CM | POA: Diagnosis not present

## 2017-04-16 MED ORDER — DOXYCYCLINE HYCLATE 100 MG PO TABS: 100.0000 mg | ORAL_TABLET | Freq: Two times a day (BID) | ORAL | 0 refills | Status: DC

## 2017-04-16 MED ORDER — PREDNISONE 10 MG PO TABS: ORAL_TABLET | ORAL | 0 refills | Status: DC

## 2017-04-16 MED ORDER — HYDROCODONE-ACETAMINOPHEN 5-325 MG PO TABS: ORAL_TABLET | ORAL | 0 refills | Status: AC

## 2017-04-16 NOTE — Discharge Instructions (Signed)
Follow up with dermatologist at [facility] on October 8th as scheduled

## 2017-04-16 NOTE — ED Provider Notes (Signed)
[facility]    CSN: [id number] Arrival date & time: 04/16/17  1741     History   Chief Complaint Chief Complaint  Patient presents with  . abscess ear    HPI Jerry Duke is a 31 y.o. male.   31 yo male with a c/o flare up of his hidradenitis suppurativa with pain, redness and drainage from skin behind right earlobe. Patient has a h/o chronic hidradenitis. States he has an appointment next month (October 8th) to establish care with a dermatologist at [facility] who specializes in hidradenitis. Denies any fevers, chills.    The history is provided by the patient.    Past Medical History:  Diagnosis Date  . Anxiety   . Hypercholesteremia   . Hypertension     There are no active problems to display for this patient.   Past Surgical History:  Procedure Laterality Date  . NASAL SINUS SURGERY    . WISDOM TOOTH EXTRACTION         Home Medications    Prior to Admission medications   Medication Sig Start Date End Date Taking? Authorizing Provider  cetirizine (ZYRTEC) 10 MG tablet Take 10 mg by mouth daily.   Yes [provider]  DULoxetine (CYMBALTA) 20 MG capsule Take 20 mg by mouth daily.   Yes [provider]  fenofibrate 160 MG tablet Take 160 mg by mouth daily.   Yes [provider]  fluticasone (FLONASE) 50 MCG/ACT nasal spray Place 1 spray into both nostrils daily.   Yes [provider]  ibuprofen (ADVIL,MOTRIN) 600 MG tablet Take 1 tablet (600 mg total) by mouth every 8 (eight) hours as needed. 02/17/17  Yes Chizaram Latino, Pamala Hurry, MD  lisinopril (PRINIVIL,ZESTRIL) 30 MG tablet Take 30 mg by mouth daily.   Yes [provider]  pregabalin (LYRICA) 150 MG capsule Take 150 mg by mouth 2 (two) times daily.   Yes [provider]  rosuvastatin (CRESTOR) 10 MG tablet Take 10 mg by mouth daily.   Yes [provider]  cyclobenzaprine (FLEXERIL) 10 MG tablet Take 1 tablet (10 mg total) by mouth at bedtime. 02/17/17    Payton Mccallum, MD  doxycycline (VIBRA-TABS) 100 MG tablet Take 1 tablet (100 mg total) by mouth 2 (two) times daily. 04/16/17   Payton Mccallum, MD  HYDROcodone-acetaminophen (NORCO/VICODIN) 5-325 MG tablet 1-2 tabs po qd prn 04/16/17   Payton Mccallum, MD  predniSONE (DELTASONE) 10 MG tablet Start 60 mg po day one, then 50 mg po day two, taper by 10 mg daily until complete. 04/16/17   Payton Mccallum, MD    Family History Family History  Problem Relation Age of Onset  . Hypertension Mother   . Hyperlipidemia Mother   . Anxiety disorder Mother   . Hypertension Father   . Hyperlipidemia Father   . Anxiety disorder Father     Social History Social History  Substance Use Topics  . Smoking status: Current Every Day Smoker    Types: Cigarettes  . Smokeless tobacco: Never Used  . Alcohol use No     Allergies   Sulfa antibiotics   Review of Systems Review of Systems   Physical Exam Triage Vital Signs ED Triage Vitals  Enc Vitals Group     BP 04/16/17 1800 125/86     Pulse Rate 04/16/17 1800 (!) 110     Resp 04/16/17 1800 16     Temp 04/16/17 1800 98.1 F (36.7 C)     Temp Source 04/16/17 1800  Oral     SpO2 04/16/17 1800 100 %     Weight 04/16/17 1803 260 lb (117.9 kg)     Height 04/16/17 1803  (1.778 m)     Head Circumference --      Peak Flow --      Pain Score 04/16/17 1803 7     Pain Loc --      Pain Edu? --      Excl. in GC? --    No data found.   Updated Vital Signs BP 125/86 (BP Location: Left Arm)   Pulse (!) 110   Temp 98.1 F (36.7 C) (Oral)   Resp 16   Ht  (1.778 m)   Wt 260 lb (117.9 kg)   SpO2 100%   BMI 37.31 kg/m   Visual Acuity Right Eye Distance:   Left Eye Distance:   Bilateral Distance:    Right Eye Near:   Left Eye Near:    Bilateral Near:     Physical Exam  Constitutional: He appears well-developed and well-nourished. No distress.  Skin: He is not diaphoretic. There is erythema.  Multiple pinpoint skin lesions with  scarring behind right earlobe consistent with hidradenitis   Nursing note and vitals reviewed.    UC Treatments / Results  Labs (all labs ordered are listed, but only abnormal results are displayed) Labs Reviewed - No data to display  EKG  EKG Interpretation None       Radiology No results found.  Procedures Procedures (including critical care time)  Medications Ordered in UC Medications - No data to display   Initial Impression / Assessment and Plan / UC Course  I have reviewed the triage vital signs and the nursing notes.  Pertinent labs & imaging results that were available during my care of the patient were reviewed by me and considered in my medical decision making (see chart for details).       Final Clinical Impressions(s) / UC Diagnoses   Final diagnoses:  Hidradenitis suppurativa    New Prescriptions Discharge Medication List as of 04/16/2017  7:29 PM    START taking these medications   Details  doxycycline (VIBRA-TABS) 100 MG tablet Take 1 tablet (100 mg total) by mouth 2 (two) times daily., Starting Wed 04/16/2017, Normal       1. diagnosis reviewed with patient 2. rx as per orders above; reviewed possible side effects, interactions, risks and benefits  3. Keep appointment with [facility] on October 8th 4. Follow-up prn if symptoms worsen or don't improve  Controlled Substance Prescriptions [place] Controlled Substance Registry consulted? No   Payton Mccallum, MD 04/16/17 [phone]

## 2017-04-16 NOTE — ED Triage Notes (Signed)
Patient c/o abscess on right ear and behind right ear, and swollen glands on the right side of his neck x 4 days. Patient states the abscess opened and drained once, but is now not draining. Patient has tried warm compresses.

## 2017-04-22 NOTE — Unmapped (Signed)
NEW PATIENT RHINOLOGY VISIT    SUBJECTIVE:    Reason for visit:  Cody Figueroa is seen in consultation at the request of Cody Figueroa* for the evaluation of   Chief Complaint   Patient presents with   ??? Sinusitis     hx septo & turb reduction 15 months ago   ??? Nasal Congestion     poss allergies (moved from [place] recently) uses CPAP   ??? Ear Problem     popping with CPAP       HPI:   Cody Figueroa is a 31 y.o. male who presents today for evaluation of chronic sinonasal symptoms. The patient reports symptoms beginning May 2018.    In August 2017 had left turbinectomy and septoplasty in [place], [place]. Has used Flonase years prior to surgery and discontinued. Restarted in March 2018 secondary to nasal congestion. He moved to [facility] in May 2018 and since that time complains of congestion nightly and clear rhinorrhea and posterior nasal drainage weekly. He was dx w/ OSA in summer 2017 and wears CPAP nightly; states congestion is most prominent while on CPAP and alternates sides every few hours. He denies facial pressure or pain, hyposomia. Denies sinus infections since surgery, but notes 3 ear infections on left requiring antibiotics. He continues to smoke 3-4 cigarillos a day.    The patient's medical record has been thoroughly reviewed and pertinent details include:         The patient reports anterior nasal drainage , posterior nasal drainage, nasal congestion  and throat clearing and denies  facial pressure, headache, fever , cough, shortness of breath, feeling bad , sneezing  and disturbance of smell .    Quality & Severity of symptoms: nightly alternating congestion and weekly clear rhinorrhea/ PND    Timing/Context/Circumstances: Symptoms most prominent when sleeping and wearing CPAP    Associated signs and symptoms: None    Previous therapies: Flonase, oral zyrtec, sinus surgery and septoplasty 2017    Exacerbating factors: CPAP usage    Alleviating Factors: None    The patient also denies dysphagia, odynophagia, hemoptysis, weight loss, acute vision changes, paresthesias, nuchal rigidity, or any other constitutional symptoms.    Allergy testing: no On immunotherapy: no      Past Medical/Surgical History  He  has no past medical history on file.  His  has no past surgical history on file.    Past Family/Social History  His family history is not on file.  He  reports that he has been smoking Cigarettes.  He has a 3.75 pack-year smoking history. He has never used smokeless tobacco.    Medications/Allergies/Immunizations  His current medication(s) include:    Current Outpatient Prescriptions on File Prior to Visit   Medication Sig   ??? apremilast (OTEZLA STARTER) 10 mg (4)-20 mg (4)-30 mg (47) DsPk Take per starter pack instructions   ??? apremilast (OTEZLA) 30 mg Tab Take 1 tablet (30 mg total) by mouth Two (2) times a day.   ??? cefdinir (OMNICEF) 300 MG capsule 300mg  twice daily   ??? cetirizine (ZYRTEC) 10 MG tablet Take 10 mg by mouth.   ??? ciclopirox (PENLAC) 8 % solution Apply topically nightly. Apply over nail and surrounding skin daily. After seven (7) days, may remove with alcohol and continue cycle.   ??? cyclobenzaprine (FLEXERIL) 10 MG tablet Take 10 mg by mouth.   ??? desvenlafaxine (PRISTIQ) 50 MG 24 hr tablet    ??? fenofibrate (LOFIBRA) 160 MG tablet TK 1 T  PO D   ??? HYDROcodone-acetaminophen (NORCO) 5-325 mg per tablet Take 1-2 tablets every 6 hours as needed for pain   ??? ibuprofen (ADVIL,MOTRIN) 800 MG tablet    ??? lisinopril (PRINIVIL,ZESTRIL) 30 MG tablet TK 1 T PO D   ??? rosuvastatin (CRESTOR) 10 MG tablet Take 10 mg by mouth.     No current facility-administered medications on file prior to visit.      Allergies: Sulfasalazine,  Immunizations:   There is no immunization history on file for this patient.     Review of Systems:  As above and on the new patients intake form, otherwise the balance of 11 systems was negative.  His RSDI score today was 14, and is documented in the chart.      OBJECTIVE:    Physical Exam  Vital Signs:  BP 128/82  - Pulse 93  - Wt (!) 124.7 kg (275 lb)   General:  Well-developed, well-nourished, obese with large neck  Communication and Voice:  Clear pitch and clarity  Respiratory  Respiratory effort:  Equal inspiration and expiration without stridor  Auscultation:  No audible wheeze.  Cardiovascular  Heart:  regular rate and rhythm  Peripheral Vascular:  Warm extremities with equal pulses  Eyes: No nystagmus with equal extraocular motion bilaterally. The conjunctiva are not injected and sclera is non-icteric.  Neuro/Psych/Balance: Patient oriented to person, place, and time. Appropriate mood and affect; Gait is intact with no imbalance; Cranial nerves I-XII are grossly intact. Exam is non-focal.  Head and Face  Inspection:  Normocephalic and atraumatic. There is no obvious asymmetry or scars.  Palpation:  Facial skeleton intact without bony stepoffs. There is no tenderness over maxillary or frontal sinuses.  Salivary Glands:  No mass or tenderness.  Facial Strength:  Facial motility symmetric and full bilaterally: Cranial nerves V and VII are intact through all distributions.  ENT  Pinnas:  External ears intact and fully developed  External canals:  Canal is patent with intact skin bilaterally   Tympanic Membranes:  Clear and mobile; overlies well-aerated middle ear space. No middle masses or fluid. Hearing is grossly intact.  External nose:  There are neither lesions nor asymmetry of the nasal tip/ dorsum.  Internal Nose:  On anterior rhinoscopy, visualization posteriorly is limited on anterior examination. For this reason, to adequately evaluate posteriorly for masses, polypoid disease and/or signs of infections, nasal endoscopy is indicated. (see procedure below)  Lips, Teeth, and gums:  The mucosa of the lips, gums, hard palate, tongue, floor of mouth, and buccal region are without masses or lesions and are normally hydrated. Good dentition.  TMJ:  No pain to palpation with full mobility Oral cavity/oropharynx:  Mucosa of soft palate and posterior pharyngeal wall are without masses or lesions and are normally hydrated. No erythema or exudate with non-obstructive tonsils. Tongue protrudes midline.  Larynx:  Supraglottis not visualized due to gag reflex.  Neck  Neck and Trachea:  There is no asymmetry. Midline trachea without mass or lesion  Thyroid:  No mass or nodularity  Lymphatics:  There is no palpable lymphadenopathy along the jugulodiagastric, submental, or posterior cervical chains.  Gastrointestinal  Abdomen: Non-distended.    PROCEDURE NOTE    Procedure: Bilateral Sinonasal Endoscopy (CPT [phone])    Indication: Chronic sinusitis symptoms (J32.8). On anterior rhinoscopy, visualization posteriorly is limited on anterior examination. For this reason, to adequately evaluate posteriorly for masses, polypoid disease and/or signs of infections, nasal endoscopy is indicated.    Consent: After discussion  of the risks of the procedure (primarily pain and bleeding) and obtaining informed verbal consent, a time-out was performed confirming the patient???s name, birthdate, and procedure to be performed.    Surgeon: Aurelio Brash, Montez Hageman., MD, MPH (entire procedure performed by surgeon)    Anesthesia: None    Complications: None    Disposition: Discharged home in stable condition.     Findings:    Nasal Cavity: Mucosa appears hyperemic and inflamed  Evidence of previous surgery:  Yes.: Bilateral  Septum: Midline.  Inferior turbinates: Without hypertrophy  RIGHT:   Semilunar Hiatus: Without polyps, Without purulence.   Sphenoethmoid Recess:  Without polyps, Without purulence.   LEFT:   Semilunar Hiatus: Without polyps, Without purulence.   Sphenoethmoid Recess:  Without polyps, Without purulence.     Procedure: The 2.4mm 30 degree and/or 70 degree telescopes were used to perform nasal endoscopy on each side. Findings mentioned above. The telescopes were then withdrawn and the patient tolerated the procedure well.    Oretha Ellis Nasal Endoscopy Score    Left        ?? Polyps:  Absent (0)   ?? Edema:   Mild (1)   ?? Discharge:  None (0)    ?? Scarring:  Absent (0)   ?? Crusting:  None (0)      Total Left:  1     Right         ?? Polyps:  Absent (0)   ?? Edema:  Absent (0)   ?? Discharge: None (0)    ?? Scarring:  Absent (0)   ?? Crusting:  None (0)      Total Right:   0    Diagnostic Studies:     None      ASSESSMENT  Allergic Rhinitis  Septal Deviation  Inferior Turbinate Hypertrophy  Nasal Airway Obstruction  Snoring  OSA  Sinonasal Complaints  He  has no past medical history on file.      PLAN:  1. Discussed normal nasal cycling with patient as this is a normal phenomenon; however, his symptoms are likely exacerbated in the setting of his septal deviation and turbinate hypertrophy  2. Mr. Mitrano has inflammation in the nasal cavity noted on nasal endoscopy exam today in clinic. We have extensively discussed treatment options. The patient will start on a nasal hygiene regimen which includes BID isotoninc nasal irrigation and nasal steroid sprays (2 sprays BID each nostril). We have discussed the proper positioning for optimal use of the nasal steroid. Follow-up will be scheduled in 3-4 months to assess the response to medical therapy. Should medical therapy fail we plan to discuss other treatment options.  3. Patient was counseled on discontinuing smoking and discussed availability of resources if interested.      Note - This record has been created using [employer]. Chart creation errors have been sought, but may not always have been located. Such creation errors do not reflect on the standard of medical care.

## 2017-04-28 ENCOUNTER — Ambulatory Visit: Admission: RE | Admit: 2017-04-28 | Discharge: 2017-04-28

## 2017-04-28 DIAGNOSIS — L910 Hypertrophic scar: Secondary | ICD-10-CM

## 2017-04-28 DIAGNOSIS — B351 Tinea unguium: Principal | ICD-10-CM

## 2017-04-28 DIAGNOSIS — L732 Hidradenitis suppurativa: Secondary | ICD-10-CM

## 2017-04-28 DIAGNOSIS — Z79899 Other long term (current) drug therapy: Secondary | ICD-10-CM

## 2017-04-28 MED ORDER — APREMILAST 30 MG TABLET
ORAL_TABLET | Freq: Two times a day (BID) | ORAL | 11 refills | 0.00000 days | Status: CP
Start: 2017-04-28 — End: 2017-04-28

## 2017-04-28 MED ORDER — HYDROCODONE 5 MG-ACETAMINOPHEN 325 MG TABLET
ORAL_TABLET | 0 refills | 0 days | Status: CP
Start: 2017-04-28 — End: 2018-11-09

## 2017-04-28 MED ORDER — CICLOPIROX 8 % TOPICAL SOLUTION
Freq: Every evening | TOPICAL | 3 refills | 0.00000 days | Status: CP
Start: 2017-04-28 — End: 2017-07-27

## 2017-04-28 MED ORDER — CEFDINIR 300 MG CAPSULE
ORAL_CAPSULE | 2 refills | 0 days | Status: CP
Start: 2017-04-28 — End: 2017-12-08

## 2017-04-28 MED ORDER — APREMILAST 10 MG (4)-20 MG (4)-30 MG (47) TABLETS IN A DOSE PACK
ORAL_TABLET | ORAL | 0 refills | 0.00000 days | Status: CP
Start: 2017-04-28 — End: 2017-05-08

## 2017-04-28 MED ORDER — APREMILAST 30 MG TABLET: 30 mg | tablet | Freq: Two times a day (BID) | 11 refills | 0 days | Status: AC

## 2017-04-28 MED ORDER — APREMILAST 10 MG (4)-20 MG (4)-30 MG (47) TABLETS IN A DOSE PACK: tablet | 0 refills | 0 days | Status: AC

## 2017-04-29 ENCOUNTER — Ambulatory Visit: Admission: RE | Admit: 2017-04-29 | Discharge: 2017-04-29 | Attending: Otolaryngology | Admitting: Otolaryngology

## 2017-04-29 DIAGNOSIS — J329 Chronic sinusitis, unspecified: Secondary | ICD-10-CM

## 2017-04-29 DIAGNOSIS — J310 Chronic rhinitis: Principal | ICD-10-CM

## 2017-04-29 DIAGNOSIS — J343 Hypertrophy of nasal turbinates: Secondary | ICD-10-CM

## 2017-04-29 DIAGNOSIS — J3489 Other specified disorders of nose and nasal sinuses: Secondary | ICD-10-CM

## 2017-04-29 MED ORDER — FLUTICASONE PROPIONATE 50 MCG/ACTUATION NASAL SPRAY,SUSPENSION
2 refills | 0 days | Status: CP
Start: 2017-04-29 — End: ?

## 2017-05-08 MED ORDER — APREMILAST 10 MG (4)-20 MG (4)-30 MG (47) TABLETS IN A DOSE PACK
ORAL_TABLET | 0 refills | 0 days | Status: CP
Start: 2017-05-08 — End: 2017-05-26

## 2017-05-08 MED ORDER — APREMILAST 30 MG TABLET
ORAL_TABLET | Freq: Two times a day (BID) | ORAL | 11 refills | 0 days | Status: CP
Start: 2017-05-08 — End: 2017-05-26

## 2017-05-08 NOTE — Unmapped (Signed)
Returned call to patient.  States his insurance denied the [place].  States his PCP at [facility] informed him he could get this medication at [facility] for a lot lower price.  States he made some phone calls and because he is a patient of [facility], he can get the Otezla 30 mg for $13 per day.  Patient states there are no income requirements.  Requests the Rxs for [place] to be sent to [facility] in [place].

## 2017-05-26 MED ORDER — APREMILAST 30 MG TABLET
ORAL_TABLET | Freq: Two times a day (BID) | ORAL | 11 refills | 0.00000 days | Status: CP
Start: 2017-05-26 — End: 2017-07-07

## 2017-05-26 MED ORDER — APREMILAST 10 MG (4)-20 MG (4)-30 MG (47) TABLETS IN A DOSE PACK
ORAL_TABLET | ORAL | 0 refills | 0.00000 days | Status: CP
Start: 2017-05-26 — End: 2017-12-08

## 2017-05-27 NOTE — Unmapped (Signed)
Call from [facility].  Need clarification for the Qty of the starter pack.  The 28 day should be 55 tabs instead of 47 per [facility].

## 2017-05-27 NOTE — Unmapped (Signed)
Per test claim for Henderson [place] at the [facility], Required Specialty Pharmacy per insurance: [facility] [phone]) Specialty Pharmacy

## 2017-07-07 ENCOUNTER — Ambulatory Visit: Admission: RE | Admit: 2017-07-07 | Discharge: 2017-07-07 | Payer: Commercial Managed Care - PPO

## 2017-07-07 DIAGNOSIS — L732 Hidradenitis suppurativa: Principal | ICD-10-CM

## 2017-07-07 MED ORDER — APREMILAST 30 MG TABLET
ORAL_TABLET | Freq: Two times a day (BID) | ORAL | 3 refills | 0 days | Status: CP
Start: 2017-07-07 — End: 2017-09-08

## 2017-07-07 MED ORDER — PREDNISONE 10 MG TABLET
ORAL_TABLET | 0 refills | 0 days | Status: CP
Start: 2017-07-07 — End: 2017-12-08

## 2017-07-07 MED ORDER — DOXYCYCLINE MONOHYDRATE 100 MG TABLET
ORAL_TABLET | Freq: Two times a day (BID) | ORAL | 0 refills | 0.00000 days | Status: CP
Start: 2017-07-07 — End: 2017-12-08

## 2017-09-08 ENCOUNTER — Ambulatory Visit: Admit: 2017-09-08 | Discharge: 2017-09-09 | Payer: PRIVATE HEALTH INSURANCE

## 2017-09-08 DIAGNOSIS — L709 Acne, unspecified: Secondary | ICD-10-CM

## 2017-09-08 DIAGNOSIS — Z79899 Other long term (current) drug therapy: Secondary | ICD-10-CM

## 2017-09-08 DIAGNOSIS — L732 Hidradenitis suppurativa: Principal | ICD-10-CM

## 2017-09-08 MED ORDER — PREDNISONE 10 MG TABLET
ORAL_TABLET | 0 refills | 0 days | Status: CP
Start: 2017-09-08 — End: 2017-12-08

## 2017-09-08 MED ORDER — APREMILAST 30 MG TABLET
ORAL_TABLET | Freq: Three times a day (TID) | ORAL | 3 refills | 0 days | Status: CP
Start: 2017-09-08 — End: 2019-03-01

## 2017-09-30 ENCOUNTER — Ambulatory Visit: Admit: 2017-09-30 | Discharge: 2017-10-01 | Payer: PRIVATE HEALTH INSURANCE

## 2017-09-30 DIAGNOSIS — J310 Chronic rhinitis: Principal | ICD-10-CM

## 2017-09-30 DIAGNOSIS — J3489 Other specified disorders of nose and nasal sinuses: Secondary | ICD-10-CM

## 2017-09-30 DIAGNOSIS — J329 Chronic sinusitis, unspecified: Secondary | ICD-10-CM

## 2017-10-06 ENCOUNTER — Ambulatory Visit
Admit: 2017-10-06 | Discharge: 2017-10-06 | Payer: PRIVATE HEALTH INSURANCE | Attending: Rheumatology | Primary: Rheumatology

## 2017-10-06 ENCOUNTER — Ambulatory Visit: Admit: 2017-10-06 | Discharge: 2017-10-06 | Payer: PRIVATE HEALTH INSURANCE

## 2017-10-06 DIAGNOSIS — L732 Hidradenitis suppurativa: Principal | ICD-10-CM

## 2017-10-06 DIAGNOSIS — M797 Fibromyalgia: Principal | ICD-10-CM

## 2017-12-08 ENCOUNTER — Ambulatory Visit: Admit: 2017-12-08 | Discharge: 2017-12-09 | Payer: PRIVATE HEALTH INSURANCE

## 2017-12-08 DIAGNOSIS — Z79899 Other long term (current) drug therapy: Principal | ICD-10-CM

## 2017-12-08 DIAGNOSIS — L732 Hidradenitis suppurativa: Secondary | ICD-10-CM

## 2017-12-08 MED ORDER — CLINDAMYCIN HCL 300 MG CAPSULE
ORAL_CAPSULE | 2 refills | 0 days | Status: CP
Start: 2017-12-08 — End: 2018-04-20

## 2017-12-08 MED ORDER — PREDNISONE 20 MG TABLET
ORAL_TABLET | 0 refills | 0 days | Status: CP
Start: 2017-12-08 — End: 2018-11-09

## 2017-12-08 MED ORDER — DAPSONE 25 MG TABLET
ORAL_TABLET | 2 refills | 0 days | Status: CP
Start: 2017-12-08 — End: 2018-02-17

## 2018-02-05 ENCOUNTER — Other Ambulatory Visit: Payer: Self-pay | Admitting: Obstetrics and Gynecology

## 2018-02-12 LAB — SEMEN ANALYSIS, BASIC
AGGLUTINATION: NONE SEEN
APPEARANCE: NORMAL
Bacteria: NONE SEEN /hpf
CONCENTRATION, SPERM: 86.9 x10E6/mL (ref >14.9)
Debris: NONE SEEN /hpf
EPITHELIAL CELLS: 1 /HPF
Immature Germ Cell Conc.: 1.1 x10E6/mL
Immotile Sperm: 58 %
LEUKOCYTE CONCENTRATION: 1.1 x10E6/mL — AB (ref <1.00)
NON-SPECIFIC AGGREGATION: NONE SEEN
NONPROGRESSIVE SPERM: 14 x10E6
NORMAL MORPHOLOGY-STRICT: 14 % (ref >3)
Non-Progressive (NP): 7 %
PH: 8.5 (ref >7.1)
PROGRESSIVELY MOTILE SPERM: 69.7 x10E6
Progressive Motility (PR): 35 % (ref >31)
RBC: 0 /hpf
Round Cell Concentration: 2.2 x10E6/mL
TIME COLLECTED: 1208
TIME RECEIVED: 1230
TOTAL MOTILE SPERM: 84.1 x10E6
Time Since Last Emission: 48 hours
Total Motility (PR+NP): 42 % (ref >39)
Total Sperm in Ejaculate: 199.9 x10E6 (ref >38.9)
Trichomonas: NONE SEEN /hpf
VOLUME: 2.3 mL (ref >1.4)

## 2018-02-17 MED ORDER — DAPSONE 25 MG TABLET
ORAL_TABLET | 2 refills | 0.00000 days | Status: CP
Start: 2018-02-17 — End: 2018-03-02

## 2018-03-02 ENCOUNTER — Ambulatory Visit: Admit: 2018-03-02 | Discharge: 2018-03-03 | Payer: PRIVATE HEALTH INSURANCE

## 2018-03-02 DIAGNOSIS — L732 Hidradenitis suppurativa: Secondary | ICD-10-CM

## 2018-03-02 DIAGNOSIS — Z79899 Other long term (current) drug therapy: Principal | ICD-10-CM

## 2018-03-02 MED ORDER — PREDNISONE 10 MG TABLET
ORAL_TABLET | 0 refills | 0 days | Status: CP
Start: 2018-03-02 — End: 2018-04-20

## 2018-03-02 MED ORDER — DAPSONE 100 MG TABLET
ORAL_TABLET | 3 refills | 0 days | Status: CP
Start: 2018-03-02 — End: 2019-03-01

## 2018-04-20 MED ORDER — CLINDAMYCIN HCL 300 MG CAPSULE
ORAL_CAPSULE | 2 refills | 0 days | Status: CP
Start: 2018-04-20 — End: 2018-11-09

## 2018-04-20 MED ORDER — PREDNISONE 10 MG TABLET
ORAL_TABLET | 0 refills | 0 days | Status: CP
Start: 2018-04-20 — End: 2018-11-09

## 2018-05-21 ENCOUNTER — Ambulatory Visit (INDEPENDENT_AMBULATORY_CARE_PROVIDER_SITE_OTHER): Payer: 59

## 2018-05-21 ENCOUNTER — Ambulatory Visit
Admission: EM | Admit: 2018-05-21 | Discharge: 2018-05-21 | Disposition: A | Discharge status: Home / Self Care | Payer: 59 | Attending: Family Medicine | Admitting: Family Medicine

## 2018-05-21 ENCOUNTER — Other Ambulatory Visit: Payer: Self-pay

## 2018-05-21 DIAGNOSIS — R69 Illness, unspecified: Secondary | ICD-10-CM

## 2018-05-21 DIAGNOSIS — R05 Cough: Secondary | ICD-10-CM | POA: Diagnosis not present

## 2018-05-21 DIAGNOSIS — J111 Influenza due to unidentified influenza virus with other respiratory manifestations: Secondary | ICD-10-CM

## 2018-05-21 DIAGNOSIS — R062 Wheezing: Secondary | ICD-10-CM | POA: Diagnosis not present

## 2018-05-21 MED ORDER — FLUTICASONE PROPIONATE 50 MCG/ACT NA SUSP: 2.0000 | Freq: Every day | NASAL | 0 refills | Status: AC

## 2018-05-21 MED ORDER — BENZONATATE 200 MG PO CAPS: ORAL_CAPSULE | ORAL | 0 refills | Status: DC

## 2018-05-21 MED ORDER — HYDROCOD POLST-CPM POLST ER 10-8 MG/5ML PO SUER
5.0000 mL | Freq: Two times a day (BID) | ORAL | 0 refills | Status: AC
Start: 2018-05-21 — End: ?

## 2018-05-21 MED ORDER — ALBUTEROL SULFATE HFA 108 (90 BASE) MCG/ACT IN AERS: 1.0000 | INHALATION_SPRAY | Freq: Four times a day (QID) | RESPIRATORY_TRACT | 0 refills | Status: AC | PRN

## 2018-05-21 NOTE — ED Provider Notes (Signed)
[facility]    CSN: [id number] Arrival date & time: 05/21/18  1337     History   Chief Complaint Chief Complaint  Patient presents with  . Cough    HPI Jerry Duke is a 32 y.o. male.   HPI  32 year old male presents with complaints of cough congestion sinus pain and pressure.  Had  Fever& body aches that started about 9 days ago.  Fever was 102 2 nights ago when he took it.  He is afebrile today.  The patient was seen at a [facility] on Sunday 4 days prior to this visit.  At time he was given Augmentin and promethazine.  Augmentin was for sinus infection and promethazine for his cough.  States that at that time he had undergone a flu test which was negative and a strep test which also was negative.  He states that despite all the medications that he has been taken in the amount of time he has not improved and has worsened somewhat with the cough and body aches.  Did not have his flu shot this year         Past Medical History:  Diagnosis Date  . Anxiety   . Hypercholesteremia   . Hypertension     There are no active problems to display for this patient.   Past Surgical History:  Procedure Laterality Date  . NASAL SINUS SURGERY    . WISDOM TOOTH EXTRACTION         Home Medications    Prior to Admission medications   Medication Sig Start Date End Date Taking? Authorizing Provider  cetirizine (ZYRTEC) 10 MG tablet Take 10 mg by mouth daily.   Yes [provider]  DULoxetine (CYMBALTA) 20 MG capsule Take 20 mg by mouth daily.   Yes [provider]  fenofibrate 160 MG tablet Take 160 mg by mouth daily.   Yes [provider]  HYDROcodone-acetaminophen (NORCO/VICODIN) 5-325 MG tablet 1-2 tabs po qd prn 04/16/17  Yes Conty, [place], MD  ibuprofen (ADVIL,MOTRIN) 600 MG tablet Take 1 tablet (600 mg total) by mouth every 8 (eight) hours as needed. 02/17/17  Yes Conty, Pamala Hurry, MD  lisinopril (PRINIVIL,ZESTRIL) 30 MG tablet Take 30 mg  by mouth daily.   Yes [provider]  pregabalin (LYRICA) 150 MG capsule Take 150 mg by mouth 2 (two) times daily.   Yes [provider]  rosuvastatin (CRESTOR) 10 MG tablet Take 10 mg by mouth daily.   Yes [provider]  albuterol (PROVENTIL HFA;VENTOLIN HFA) 108 (90 Base) MCG/ACT inhaler Inhale 1-2 puffs into the lungs every 6 (six) hours as needed for wheezing or shortness of breath. Use with spacer 05/21/18   Lutricia Feil, PA-C  benzonatate (TESSALON) 200 MG capsule Take one cap TID PRN cough 05/21/18   Lutricia Feil, PA-C  chlorpheniramine-HYDROcodone [facility]) 10-8 MG/5ML SUER Take 5 mLs by mouth 2 (two) times daily. 05/21/18   Lutricia Feil, PA-C  fluticasone (FLONASE) 50 MCG/ACT nasal spray Place 2 sprays into both nostrils daily. 05/21/18   Lutricia Feil, PA-C    Family History Family History  Problem Relation Age of Onset  . Hypertension Mother   . Hyperlipidemia Mother   . Anxiety disorder Mother   . Hypertension Father   . Hyperlipidemia Father   . Anxiety disorder Father     Social History Social History   Tobacco Use  . Smoking status: Current Every Day Smoker  Packs/day: 0.25    Types: Cigarettes  . Smokeless tobacco: Never Used  Substance Use Topics  . Alcohol use: Yes    Comment: rare  . Drug use: No     Allergies   Sulfa antibiotics   Review of Systems Review of Systems  Constitutional: Positive for activity change, chills, fatigue and fever. Negative for appetite change.  HENT: Positive for congestion and postnasal drip.   Respiratory: Positive for cough and shortness of breath.   All other systems reviewed and are negative.    Physical Exam Triage Vital Signs ED Triage Vitals  Enc Vitals Group     BP 05/21/18 1357 (!) 133/95     Pulse Rate 05/21/18 1357 94     Resp 05/21/18 1357 18     Temp 05/21/18 1357 98.8 F (37.1 C)     Temp Source 05/21/18 1357 Oral     SpO2 05/21/18  1357 99 %     Weight 05/21/18 1355 260 lb (117.9 kg)     Height 05/21/18 1355 5\' 10"  (1.778 m)     Head Circumference --      Peak Flow --      Pain Score 05/21/18 1354 5     Pain Loc --      Pain Edu? --      Excl. in GC? --    No data found.  Updated Vital Signs BP (!) 133/95 (BP Location: Right Arm)   Pulse 94   Temp 98.8 F (37.1 C) (Oral)   Resp 18   Ht 5\' 10"  (1.778 m)   Wt 260 lb (117.9 kg)   SpO2 99%   BMI 37.31 kg/m   Visual Acuity Right Eye Distance:   Left Eye Distance:   Bilateral Distance:    Right Eye Near:   Left Eye Near:    Bilateral Near:     Physical Exam  Constitutional: He is oriented to person, place, and time. He appears well-developed and well-nourished. No distress.  HENT:  Head: Normocephalic.  Right Ear: External ear normal.  Left Ear: External ear normal.  Nose: Nose normal.  Mouth/Throat: Oropharynx is clear and moist. No oropharyngeal exudate.  Eyes: Pupils are equal, round, and reactive to light. Right eye exhibits no discharge. Left eye exhibits no discharge.  Neck: Normal range of motion.  Pulmonary/Chest: Effort normal. He has wheezes. He has rales.  Musculoskeletal: Normal range of motion.  Lymphadenopathy:    He has no cervical adenopathy.  Neurological: He is alert and oriented to person, place, and time.  Skin: Skin is warm and dry. He is not diaphoretic.  Psychiatric: He has a normal mood and affect. His behavior is normal. Judgment and thought content normal.  Nursing note and vitals reviewed.    UC Treatments / Results  Labs (all labs ordered are listed, but only abnormal results are displayed) Labs Reviewed - No data to display  EKG None  Radiology Dg Chest 2 View  Result Date: 05/21/2018 CLINICAL DATA:  Cough, congestion and fever since Saturday. Concern for pneumonia. EXAM: CHEST - 2 VIEW COMPARISON:  None. FINDINGS: Heart size and mediastinal contours are within normal limits. Lungs are clear. No pleural  effusion. Osseous structures about the chest are unremarkable. IMPRESSION: No active cardiopulmonary disease.  No evidence of pneumonia. Electronically Signed   By: Bary Richard M.D.   On: 05/21/2018 15:01    Procedures Procedures (including critical care time)  Medications Ordered in UC Medications - No data to  display  Initial Impression / Assessment and Plan / UC Course  I have reviewed the triage vital signs and the nursing notes.  Pertinent labs & imaging results that were available during my care of the patient were reviewed by me and considered in my medical decision making (see chart for details).     He has an influenza type illness.Unfortunately he is beyond the benefit  of Tamiflu.  Told him that he will have to write out the storm.  We will provide him with albuterol because of the wheezing that he has.  Do the x-ray with him notifying him that he has no active pulmonary disease and no pneumonia.  Recommend the use of Flonase.  Treat him with a cough suppressants.  If he is not improving or worsens he should follow-up with his primary care physician. Final Clinical Impressions(s) / UC Diagnoses   Final diagnoses:  Influenza-like illness   Discharge Instructions   None    ED Prescriptions    Medication Sig Dispense Auth. Provider   albuterol (PROVENTIL HFA;VENTOLIN HFA) 108 (90 Base) MCG/ACT inhaler Inhale 1-2 puffs into the lungs every 6 (six) hours as needed for wheezing or shortness of breath. Use with spacer 1 Inhaler Lutricia Feil, PA-C   chlorpheniramine-HYDROcodone (TUSSIONEX PENNKINETIC ER) 10-8 MG/5ML SUER Take 5 mLs by mouth 2 (two) times daily. 115 mL Ovid Curd P, PA-C   fluticasone (FLONASE) 50 MCG/ACT nasal spray Place 2 sprays into both nostrils daily. 16 g Ovid Curd P, PA-C   benzonatate (TESSALON) 200 MG capsule Take one cap TID PRN cough 30 capsule Lutricia Feil, PA-C     Controlled Substance Prescriptions [place] Controlled Substance  Registry consulted? Not Applicable   Lutricia Feil, PA-C 05/21/18 1537

## 2018-05-21 NOTE — ED Triage Notes (Signed)
Patient complains of cough, congestion, sinus pain and pressure, fever worse in the evening, body aches x 9 days. Patient states that he was seen by [facility] on Sunday and given augmentin and promethazine cough syrup. Patient states that symptoms have seemed to worsen and he is concerned about possible pneumonia. Was tested for flu and strep and were both negative on Sunday per patient.

## 2018-05-31 ENCOUNTER — Encounter: Payer: Self-pay | Admitting: Emergency Medicine

## 2018-05-31 ENCOUNTER — Emergency Department
Admission: EM | Admit: 2018-05-31 | Discharge: 2018-05-31 | Disposition: A | Discharge status: Home / Self Care | Payer: 59 | Attending: Emergency Medicine | Admitting: Emergency Medicine

## 2018-05-31 ENCOUNTER — Other Ambulatory Visit: Payer: Self-pay

## 2018-05-31 ENCOUNTER — Emergency Department: Payer: 59

## 2018-05-31 DIAGNOSIS — F17210 Nicotine dependence, cigarettes, uncomplicated: Secondary | ICD-10-CM | POA: Diagnosis not present

## 2018-05-31 DIAGNOSIS — Z79899 Other long term (current) drug therapy: Secondary | ICD-10-CM | POA: Diagnosis not present

## 2018-05-31 DIAGNOSIS — J069 Acute upper respiratory infection, unspecified: Secondary | ICD-10-CM | POA: Diagnosis not present

## 2018-05-31 DIAGNOSIS — R05 Cough: Secondary | ICD-10-CM | POA: Diagnosis present

## 2018-05-31 MED ORDER — BENZONATATE 100 MG PO CAPS: 100.0000 mg | ORAL_CAPSULE | Freq: Three times a day (TID) | ORAL | 0 refills | Status: AC | PRN

## 2018-05-31 MED ORDER — AZITHROMYCIN 250 MG PO TABS: ORAL_TABLET | ORAL | 0 refills | Status: AC

## 2018-05-31 MED ORDER — METHYLPREDNISOLONE SODIUM SUCC 125 MG IJ SOLR
125.0000 mg | Freq: Once | INTRAMUSCULAR | Status: AC
  Administered 2018-05-31: 125 mg via INTRAMUSCULAR
  Filled 2018-05-31: qty 2

## 2018-05-31 MED ORDER — PREDNISONE 10 MG PO TABS: ORAL_TABLET | ORAL | 0 refills | Status: AC

## 2018-05-31 MED ORDER — PSEUDOEPH-BROMPHEN-DM 30-2-10 MG/5ML PO SYRP: 5.0000 mL | ORAL_SOLUTION | Freq: Four times a day (QID) | ORAL | 0 refills | Status: AC | PRN

## 2018-05-31 NOTE — ED Triage Notes (Signed)
Pt to ED via POV, pt states that he was seen at [facility] 10 days ago and dx with flu. Pt states that he is still coughing. Pt is in NAD at this time.

## 2018-05-31 NOTE — ED Notes (Signed)
IM meds given as ordered.

## 2018-05-31 NOTE — ED Provider Notes (Signed)
[facility] Emergency Department Provider Note  ____________________________________________  Time seen: Approximately 6:07 PM  I have reviewed the triage vital signs and the nursing notes.   HISTORY  Chief Complaint Cough    HPI Jerry Duke is a 32 y.o. male that presents to the emergency department for evaluation of nasal congestion, sore throat, nonproductive cough for 10+ days.  Patient states that he was seen at urgent care 10 days ago and was diagnosed with influenza.  He is still have nasal congestion, sore throat, cough.  He smokes a couple of cigarillos a day.  No asthma or allergies.  No fever, chills, shortness of breath, chest pain.   Past Medical History:  Diagnosis Date  . Anxiety   . Hypercholesteremia   . Hypertension     There are no active problems to display for this patient.   Past Surgical History:  Procedure Laterality Date  . NASAL SINUS SURGERY    . WISDOM TOOTH EXTRACTION      Prior to Admission medications   Medication Sig Start Date End Date Taking? Authorizing Provider  albuterol (PROVENTIL HFA;VENTOLIN HFA) 108 (90 Base) MCG/ACT inhaler Inhale 1-2 puffs into the lungs every 6 (six) hours as needed for wheezing or shortness of breath. Use with spacer 05/21/18   Lutricia Feil, PA-C  azithromycin (ZITHROMAX Z-PAK) 250 MG tablet Take 2 tablets (500 mg) on  Day 1,  followed by 1 tablet (250 mg) once daily on Days 2 through 5. 05/31/18   Enid Derry, PA-C  benzonatate (TESSALON PERLES) 100 MG capsule Take 1 capsule (100 mg total) by mouth 3 (three) times daily as needed for cough. 05/31/18 05/31/19  Enid Derry, PA-C  brompheniramine-pseudoephedrine-DM 30-2-10 MG/5ML syrup Take 5 mLs by mouth 4 (four) times daily as needed. 05/31/18   Enid Derry, PA-C  cetirizine (ZYRTEC) 10 MG tablet Take 10 mg by mouth daily.    [provider]  chlorpheniramine-HYDROcodone (TUSSIONEX PENNKINETIC ER) 10-8 MG/5ML SUER Take  5 mLs by mouth 2 (two) times daily. 05/21/18   Lutricia Feil, PA-C  DULoxetine (CYMBALTA) 20 MG capsule Take 20 mg by mouth daily.    [provider]  fenofibrate 160 MG tablet Take 160 mg by mouth daily.    [provider]  fluticasone (FLONASE) 50 MCG/ACT nasal spray Place 2 sprays into both nostrils daily. 05/21/18   Lutricia Feil, PA-C  HYDROcodone-acetaminophen (NORCO/VICODIN) 5-325 MG tablet 1-2 tabs po qd prn 04/16/17   Payton Mccallum, MD  ibuprofen (ADVIL,MOTRIN) 600 MG tablet Take 1 tablet (600 mg total) by mouth every 8 (eight) hours as needed. 02/17/17   Payton Mccallum, MD  lisinopril (PRINIVIL,ZESTRIL) 30 MG tablet Take 30 mg by mouth daily.    [provider]  predniSONE (DELTASONE) 10 MG tablet Take 6 tablets on day 1, take 5 tablets on day 2, take 4 tablets on day 3, take 3 tablets on day 4, take 2 tablets on day 5, take 1 tablet on day 6 05/31/18   Enid Derry, PA-C  pregabalin (LYRICA) 150 MG capsule Take 150 mg by mouth 2 (two) times daily.    [provider]  rosuvastatin (CRESTOR) 10 MG tablet Take 10 mg by mouth daily.    [provider]    Allergies Sulfa antibiotics  Family History  Problem Relation Age of Onset  . Hypertension Mother   . Hyperlipidemia Mother   . Anxiety disorder Mother   . Hypertension Father   . Hyperlipidemia  Father   . Anxiety disorder Father     Social History Social History   Tobacco Use  . Smoking status: Current Every Day Smoker    Packs/day: 0.25    Types: Cigarettes  . Smokeless tobacco: Never Used  Substance Use Topics  . Alcohol use: Yes    Comment: rare  . Drug use: No     Review of Systems  Constitutional: No fever/chills Eyes: No visual changes. No discharge. ENT: Positive for congestion and rhinorrhea. Cardiovascular: No chest pain. Respiratory: Positive for cough. No SOB. Gastrointestinal: No abdominal pain.  No nausea, no vomiting.  No diarrhea.  No  constipation. Musculoskeletal: Negative for musculoskeletal pain. Skin: Negative for rash, abrasions, lacerations, ecchymosis. Neurological: Negative for headaches.   ____________________________________________   PHYSICAL EXAM:  VITAL SIGNS: ED Triage Vitals [05/31/18 1634]  Enc Vitals Group     BP 135/80     Pulse Rate 98     Resp 18     Temp 98.2 F (36.8 C)     Temp Source Oral     SpO2 100 %     Weight      Height      Head Circumference      Peak Flow      Pain Score 0     Pain Loc      Pain Edu?      Excl. in GC?      Constitutional: Alert and oriented. Well appearing and in no acute distress. Eyes: Conjunctivae are normal. PERRL. EOMI. No discharge. Head: Atraumatic. ENT: No frontal and maxillary sinus tenderness.      Ears: Tympanic membranes pearly gray with good landmarks. No discharge.      Nose: Mild congestion/rhinnorhea.      Mouth/Throat: Mucous membranes are moist. Oropharynx non-erythematous. Tonsils not enlarged. No exudates. Uvula midline. Neck: No stridor.   Hematological/Lymphatic/Immunilogical: No cervical lymphadenopathy. Cardiovascular: Normal rate, regular rhythm.  Good peripheral circulation. Respiratory: Congested cough. Normal respiratory effort without tachypnea or retractions. Lungs CTAB. Good air entry to the bases with no decreased or absent breath sounds. Gastrointestinal: Bowel sounds 4 quadrants. Soft and nontender to palpation. No guarding or rigidity. No palpable masses. No distention. Musculoskeletal: Full range of motion to all extremities. No gross deformities appreciated. Neurologic:  Normal speech and language. No gross focal neurologic deficits are appreciated.  Skin:  Skin is warm, dry and intact. No rash noted. Psychiatric: Mood and affect are normal. Speech and behavior are normal. Patient exhibits appropriate insight and judgement.   ____________________________________________   LABS (all labs ordered are listed,  but only abnormal results are displayed)  Labs Reviewed - No data to display ____________________________________________  EKG   ____________________________________________  RADIOLOGY Lexine Baton, personally viewed and evaluated these images (plain radiographs) as part of my medical decision making, as well as reviewing the written report by the radiologist.  Dg Chest 2 View  Result Date: 05/31/2018 CLINICAL DATA:  Cough, fever. EXAM: CHEST - 2 VIEW COMPARISON:  Radiographs of May 21, 2018. FINDINGS: The heart size and mediastinal contours are within normal limits. Both lungs are clear. The visualized skeletal structures are unremarkable. IMPRESSION: No active cardiopulmonary disease. Electronically Signed   By: Lupita Raider, M.D.   On: 05/31/2018 18:13    ____________________________________________    PROCEDURES  Procedure(s) performed:    Procedures    Medications  methylPREDNISolone sodium succinate (SOLU-MEDROL) 125 mg/2 mL injection 125 mg (125 mg Intramuscular Given 05/31/18 1834)  ____________________________________________   INITIAL IMPRESSION / ASSESSMENT AND PLAN / ED COURSE  Pertinent labs & imaging results that were available during my care of the patient were reviewed by me and considered in my medical decision making (see chart for details).  Review of the [place] CSRS was performed in accordance of the [facility] prior to dispensing any controlled drugs.     Patient's diagnosis is consistent with URI with cough and congestion.  Vital signs and exam are reassuring.  Chest x-ray negative for acute cardiopulmonary processes.  IM Solu-Medrol was given.  Patient will be discharged home with prescriptions for prednisone, azithromycin, Robitussin, Tessalon Perles. Patient is to follow up with primary care as needed or otherwise directed. Patient is given ED precautions to return to the ED for any worsening or new  symptoms.     ____________________________________________  FINAL CLINICAL IMPRESSION(S) / ED DIAGNOSES  Final diagnoses:  URI with cough and congestion      NEW MEDICATIONS STARTED DURING THIS VISIT:  ED Discharge Orders         Ordered    predniSONE (DELTASONE) 10 MG tablet     05/31/18 1828    azithromycin (ZITHROMAX Z-PAK) 250 MG tablet     05/31/18 1828    benzonatate (TESSALON PERLES) 100 MG capsule  3 times daily PRN     05/31/18 1828    brompheniramine-pseudoephedrine-DM 30-2-10 MG/5ML syrup  4 times daily PRN     05/31/18 1828              This chart was dictated using voice recognition software/Dragon. Despite best efforts to proofread, errors can occur which can change the meaning. Any change was purely unintentional.    Enid Derry, PA-C 05/31/18 1852    Arnaldo Natal, MD 05/31/18 2112

## 2018-06-21 IMAGING — CR DG LUMBAR SPINE COMPLETE 4+V
5 series · 5 of 5 positions shown · non-contrast
Comparison: None in PACs

CLINICAL DATA: Status post fall down 5 DIS 6 steps this morning.
The patient is reporting thoracic and lumbar pain. History of
fibromyalgia.

EXAM:
LUMBAR SPINE - COMPLETE 4+ VIEW

[l-spine ap]
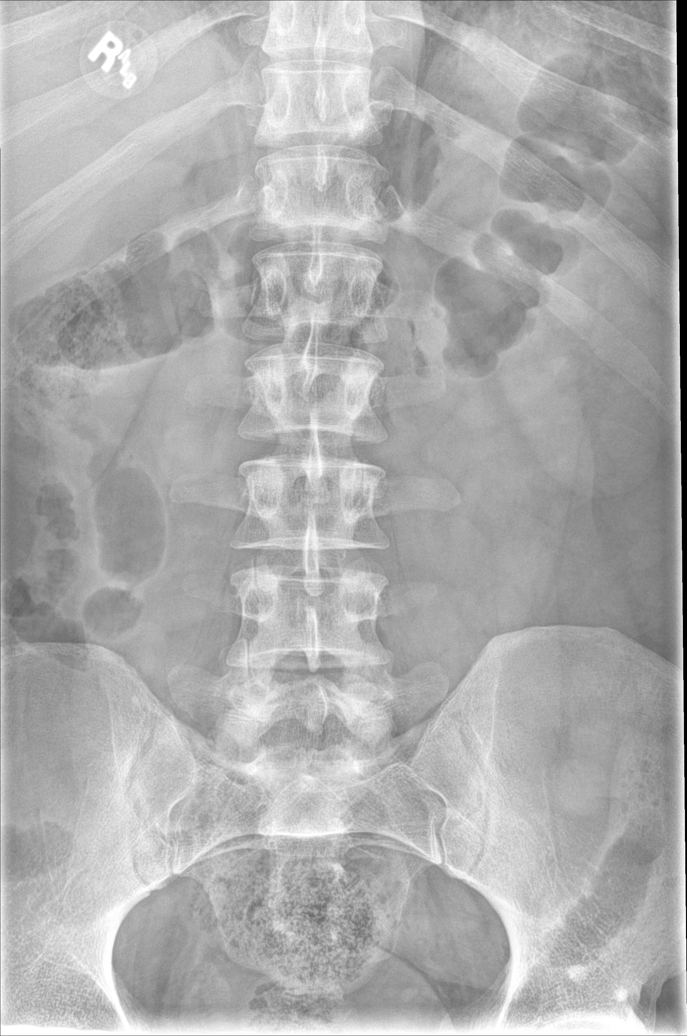

[l-spine obl (1 of 2)]
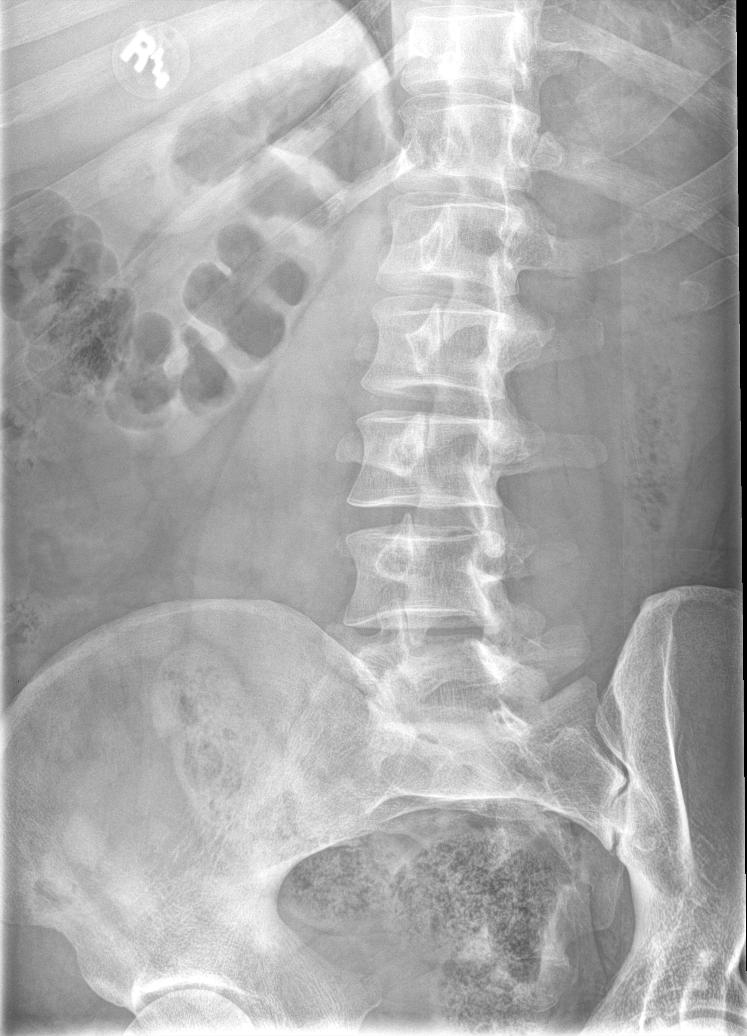

[l-spine obl (2 of 2)]
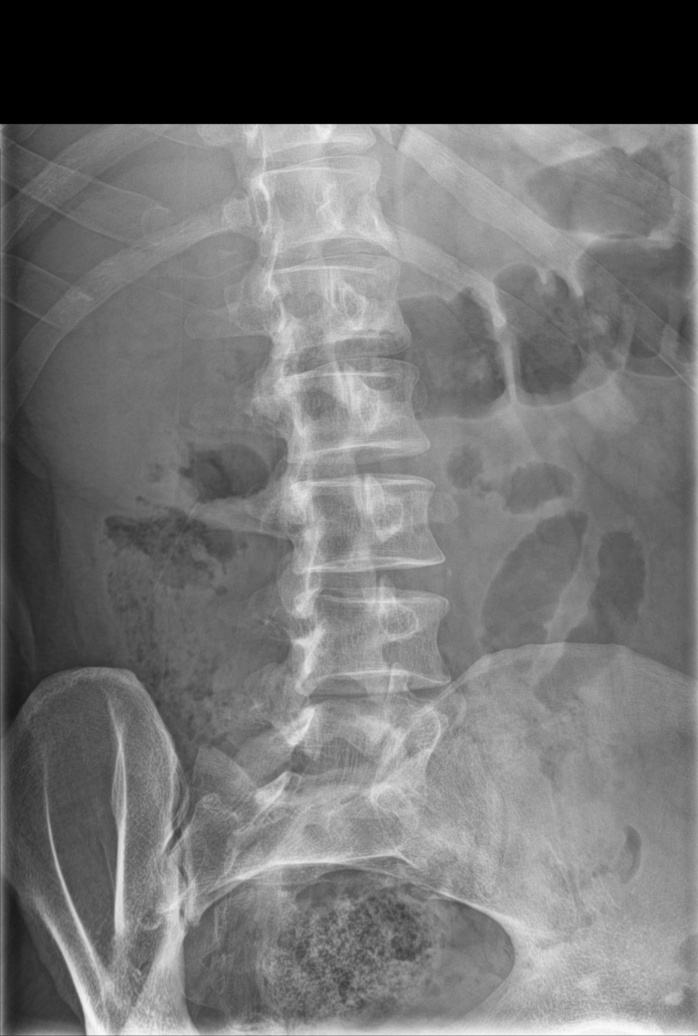

[l-spine lat]
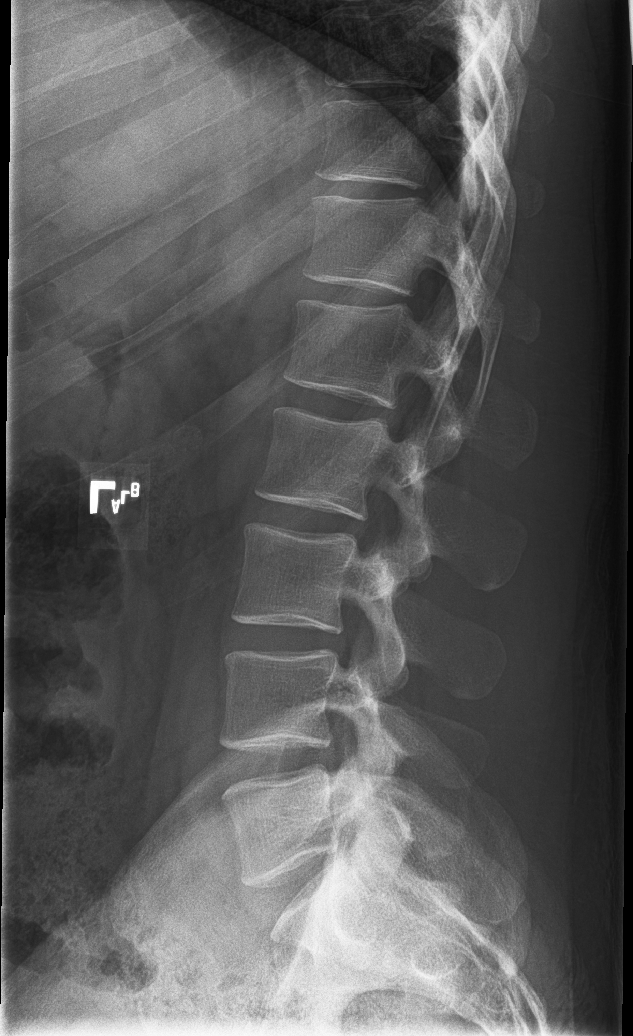

[l-spine spot]
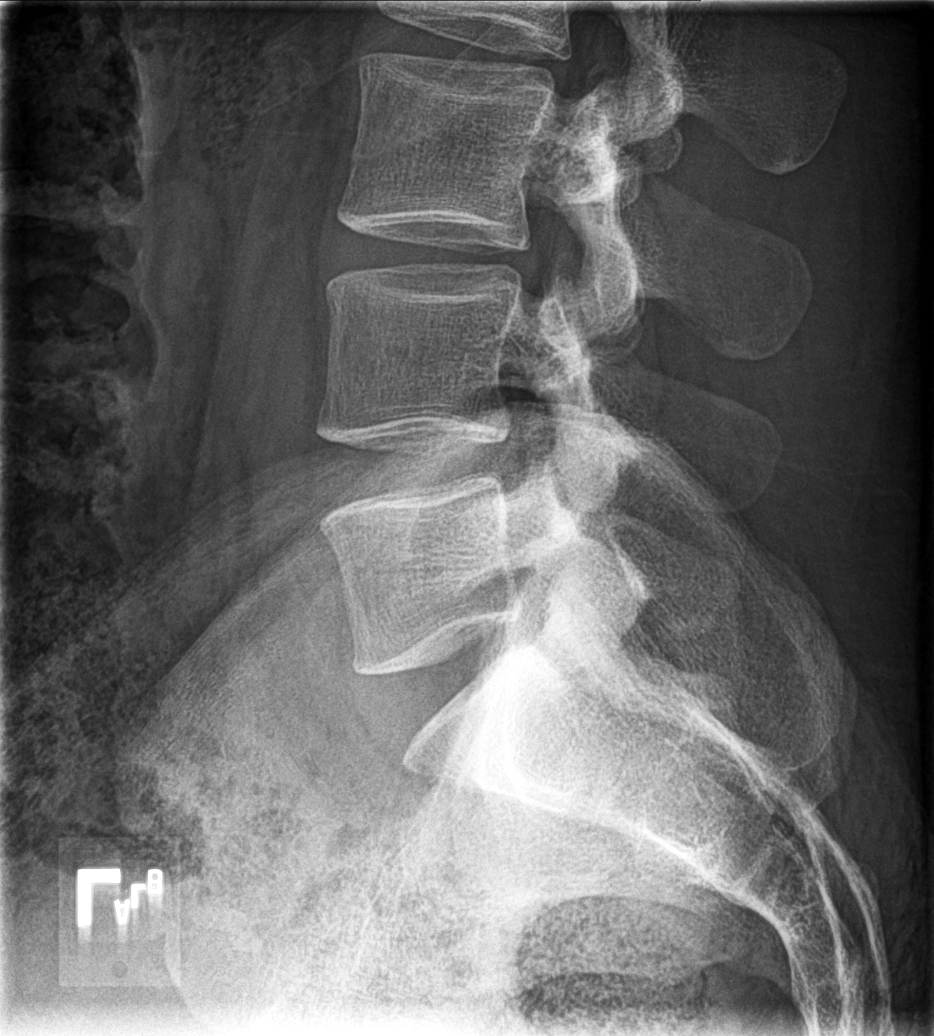

[5 of 5 positions shown; findings below may reference images not displayed]

FINDINGS: The lumbar vertebral bodies are preserved in height. The pedicles
and transverse processes are intact. There is no spondylolisthesis.
The disc space heights are well maintained. The thoracolumbar
junction is normal.
IMPRESSION: There is no acute or significant chronic bony abnormality of the
lumbar spine.

## 2018-11-09 MED ORDER — PREDNISONE 20 MG TABLET
ORAL_TABLET | 0 refills | 0 days | Status: CP
Start: 2018-11-09 — End: 2019-03-01

## 2018-11-09 MED ORDER — LIDOCAINE 5 % TOPICAL OINTMENT: g | 11 refills | 0 days | Status: AC

## 2018-11-09 MED ORDER — HYDROCODONE 5 MG-ACETAMINOPHEN 325 MG TABLET
ORAL_TABLET | ORAL | 0 refills | 0.00000 days | Status: CP
Start: 2018-11-09 — End: 2019-03-01

## 2018-11-09 MED ORDER — LIDOCAINE 5 % TOPICAL OINTMENT
TOPICAL | 11 refills | 0.00000 days | Status: CP
Start: 2018-11-09 — End: 2018-11-24

## 2018-11-09 MED ORDER — CLINDAMYCIN HCL 300 MG CAPSULE
ORAL_CAPSULE | 2 refills | 0 days | Status: CP
Start: 2018-11-09 — End: 2018-11-25

## 2018-11-24 MED ORDER — LIDOCAINE 5 % TOPICAL OINTMENT
11 refills | 0 days | Status: CP
Start: 2018-11-24 — End: ?

## 2018-11-24 MED ORDER — DIAZEPAM 5 MG TABLET
ORAL_TABLET | 0 refills | 0 days | Status: CP
Start: 2018-11-24 — End: 2019-03-01

## 2018-11-25 ENCOUNTER — Ambulatory Visit: Admit: 2018-11-25 | Discharge: 2018-11-26 | Payer: PRIVATE HEALTH INSURANCE

## 2018-11-25 DIAGNOSIS — D485 Neoplasm of uncertain behavior of skin: Secondary | ICD-10-CM

## 2018-11-25 DIAGNOSIS — L732 Hidradenitis suppurativa: Secondary | ICD-10-CM

## 2018-11-25 DIAGNOSIS — L720 Epidermal cyst: Principal | ICD-10-CM

## 2018-11-25 MED ORDER — CLINDAMYCIN HCL 300 MG CAPSULE
ORAL_CAPSULE | Freq: Two times a day (BID) | ORAL | 1 refills | 0.00000 days | Status: CP
Start: 2018-11-25 — End: 2019-01-24

## 2019-03-01 ENCOUNTER — Ambulatory Visit: Admit: 2019-03-01 | Discharge: 2019-03-02 | Payer: PRIVATE HEALTH INSURANCE

## 2019-03-01 DIAGNOSIS — L732 Hidradenitis suppurativa: Principal | ICD-10-CM

## 2019-03-01 DIAGNOSIS — D229 Melanocytic nevi, unspecified: Secondary | ICD-10-CM

## 2019-03-01 MED ORDER — CEFDINIR 300 MG CAPSULE
ORAL_CAPSULE | Freq: Two times a day (BID) | ORAL | 2 refills | 30 days | Status: CP
Start: 2019-03-01 — End: ?

## 2019-03-01 MED ORDER — METFORMIN ER 500 MG TABLET,EXTENDED RELEASE 24 HR
ORAL_TABLET | Freq: Two times a day (BID) | ORAL | 3 refills | 30 days | Status: CP
Start: 2019-03-01 — End: 2020-02-29

## 2019-08-10 DIAGNOSIS — L732 Hidradenitis suppurativa: Principal | ICD-10-CM

## 2019-08-10 MED ORDER — TRAMADOL 50 MG TABLET
ORAL_TABLET | 0 refills | 0 days | Status: CP
Start: 2019-08-10 — End: ?

## 2019-08-10 MED ORDER — MINOCYCLINE 100 MG CAPSULE
ORAL_CAPSULE | Freq: Two times a day (BID) | ORAL | 2 refills | 30 days | Status: CP
Start: 2019-08-10 — End: 2019-11-08

## 2019-08-10 MED ORDER — PREDNISONE 20 MG TABLET
ORAL_TABLET | Freq: Every day | ORAL | 0 refills | 5.00000 days | Status: CP
Start: 2019-08-10 — End: 2019-08-15

## 2019-09-24 ENCOUNTER — Ambulatory Visit: Admit: 2019-09-24 | Discharge: 2019-09-25 | Payer: PRIVATE HEALTH INSURANCE

## 2019-10-02 IMAGING — CR DG CHEST 2V
2 series · 2 of 2 positions shown · non-contrast
Comparison: Radiographs May 21, 2018.

CLINICAL DATA: Cough, fever.

EXAM:
CHEST - 2 VIEW

[chest pa]
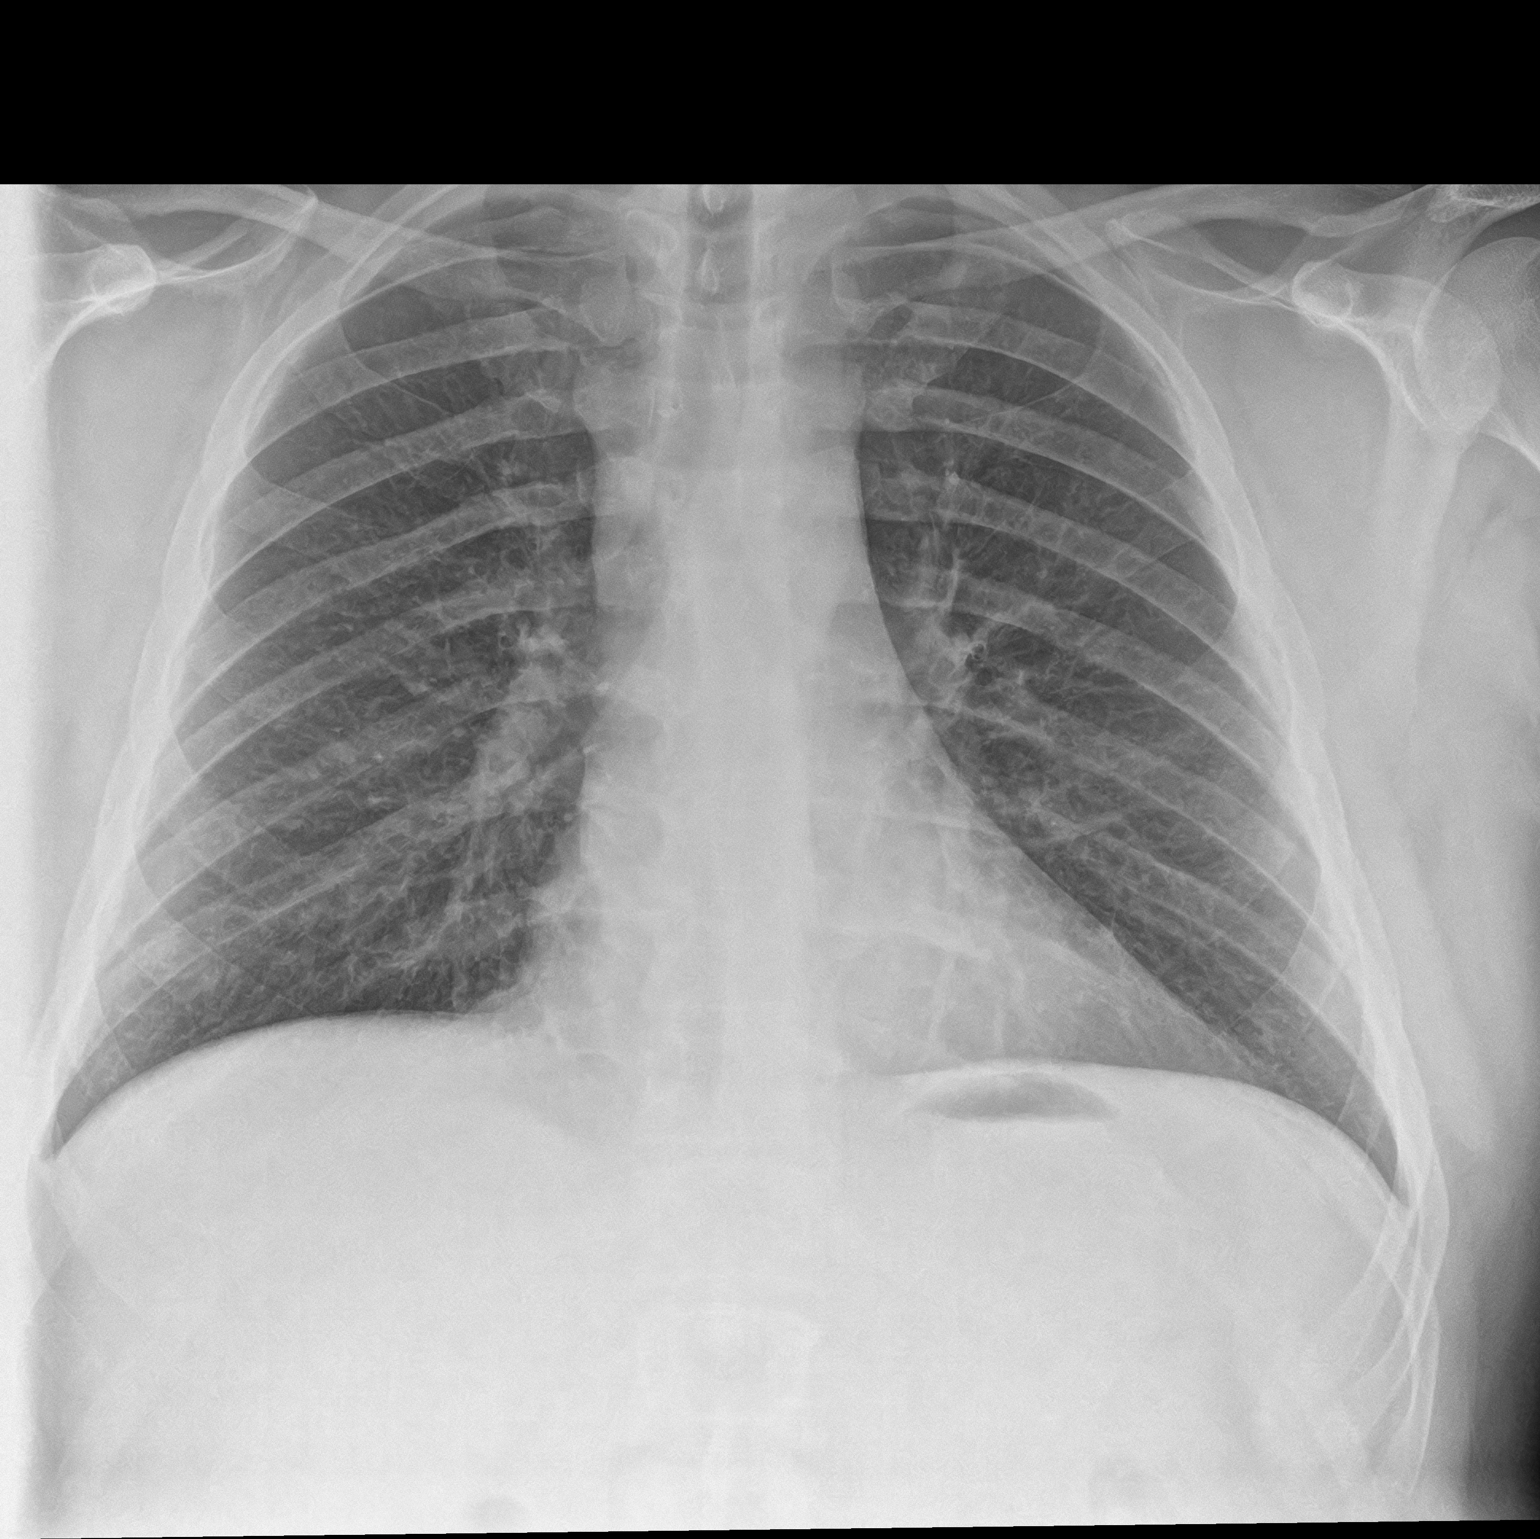

[chest lat]
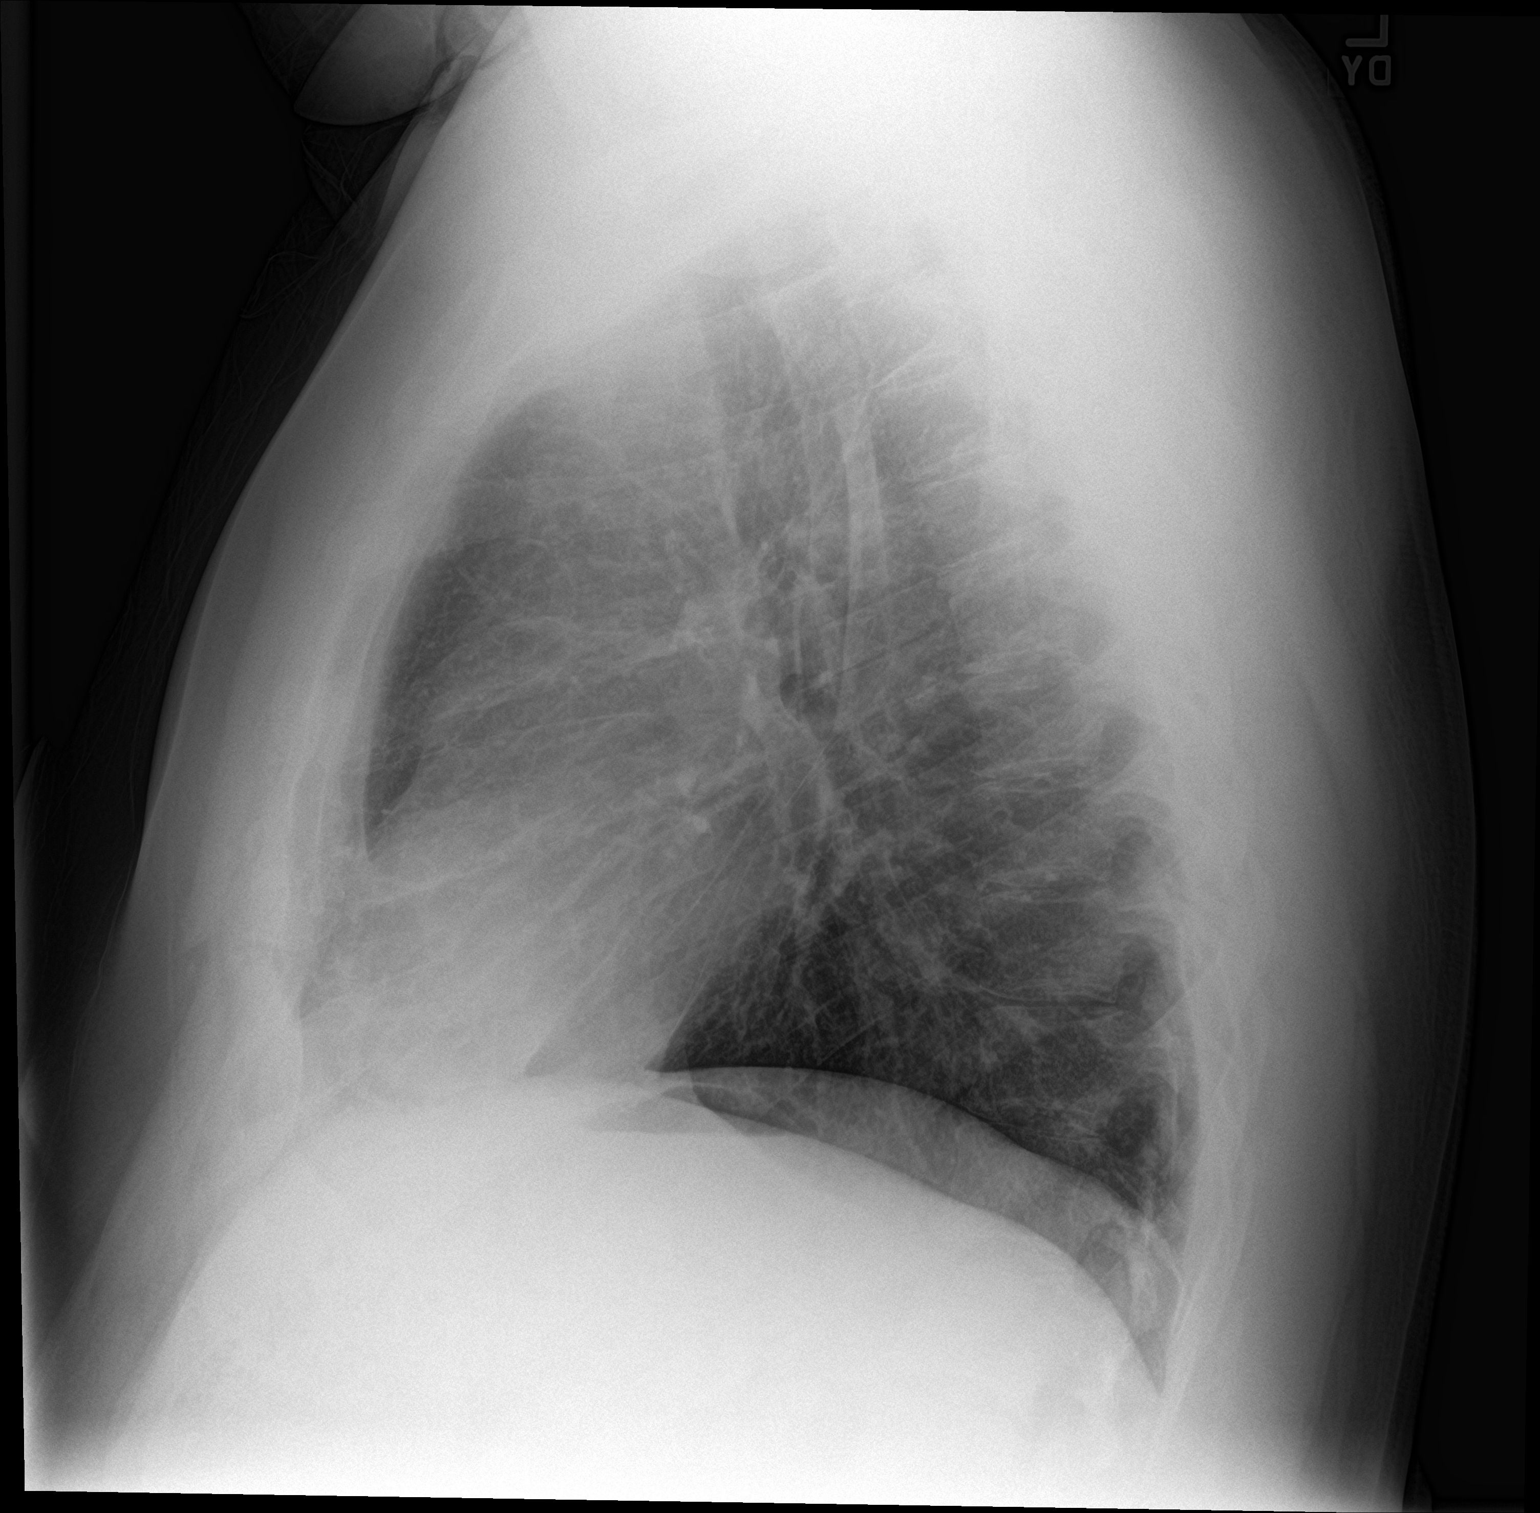

[2 of 2 positions shown; findings below may reference images not displayed]

FINDINGS: The heart size and mediastinal contours are within normal limits.
Both lungs are clear. The visualized skeletal structures are
unremarkable.
IMPRESSION: No active cardiopulmonary disease.

## 2019-10-22 ENCOUNTER — Ambulatory Visit: Admit: 2019-10-22 | Discharge: 2019-10-23 | Payer: PRIVATE HEALTH INSURANCE

## 2020-05-26 ENCOUNTER — Ambulatory Visit: Admit: 2020-05-26 | Discharge: 2020-05-27 | Payer: PRIVATE HEALTH INSURANCE

## 2022-08-28 ENCOUNTER — Ambulatory Visit: Admit: 2022-08-28 | Discharge: 2022-08-29 | Payer: PRIVATE HEALTH INSURANCE

## 2022-08-28 DIAGNOSIS — B351 Tinea unguium: Principal | ICD-10-CM

## 2022-08-28 DIAGNOSIS — L732 Hidradenitis suppurativa: Principal | ICD-10-CM

## 2022-08-28 DIAGNOSIS — L859 Epidermal thickening, unspecified: Principal | ICD-10-CM

## 2022-08-28 DIAGNOSIS — F411 Generalized anxiety disorder: Principal | ICD-10-CM

## 2022-08-28 MED ORDER — CLINDAMYCIN HCL 300 MG CAPSULE
ORAL_CAPSULE | 3 refills | 0.00000 days | Status: CN
Start: 2022-08-28 — End: ?

## 2022-08-28 MED ORDER — PREDNISONE 10 MG TABLET
ORAL_TABLET | ORAL | 1 refills | 20.00000 days | Status: CP
Start: 2022-08-28 — End: 2022-09-17

## 2022-08-28 MED ORDER — RIFAMPIN 300 MG CAPSULE
ORAL_CAPSULE | Freq: Two times a day (BID) | ORAL | 2 refills | 30.00000 days | Status: CN
Start: 2022-08-28 — End: ?

## 2022-08-28 MED ORDER — DIAZEPAM 5 MG TABLET
ORAL_TABLET | ORAL | 0 refills | 0.00000 days | Status: CP
Start: 2022-08-28 — End: ?

## 2022-08-28 MED ORDER — UREA 40 % TOPICAL CREAM
Freq: Every day | TOPICAL | 0 refills | 0.00000 days | Status: CP
Start: 2022-08-28 — End: 2023-08-28

## 2022-08-28 MED ORDER — LIDOCAINE-PRILOCAINE 2.5 %-2.5 % TOPICAL CREAM
Freq: Once | TOPICAL | 0 refills | 0.00000 days | Status: CP
Start: 2022-08-28 — End: 2022-08-28

## 2022-08-28 MED ORDER — AMOXICILLIN 875 MG-POTASSIUM CLAVULANATE 125 MG TABLET
ORAL_TABLET | 3 refills | 0.00000 days | Status: CP
Start: 2022-08-28 — End: ?

## 2022-10-09 ENCOUNTER — Ambulatory Visit: Admit: 2022-10-09 | Discharge: 2022-10-10 | Payer: PRIVATE HEALTH INSURANCE

## 2022-10-09 DIAGNOSIS — D492 Neoplasm of unspecified behavior of bone, soft tissue, and skin: Principal | ICD-10-CM

## 2022-10-25 ENCOUNTER — Ambulatory Visit: Admit: 2022-10-25 | Discharge: 2022-10-26 | Payer: PRIVATE HEALTH INSURANCE

## 2022-10-25 DIAGNOSIS — Z4802 Encounter for removal of sutures: Principal | ICD-10-CM

## 2022-12-11 ENCOUNTER — Ambulatory Visit: Admit: 2022-12-11 | Discharge: 2022-12-12 | Payer: PRIVATE HEALTH INSURANCE

## 2022-12-11 DIAGNOSIS — L732 Hidradenitis suppurativa: Principal | ICD-10-CM

## 2022-12-11 MED ORDER — AMOXICILLIN 875 MG-POTASSIUM CLAVULANATE 125 MG TABLET
ORAL_TABLET | 3 refills | 0.00000 days | Status: CP
Start: 2022-12-11 — End: ?

## 2022-12-11 MED ORDER — PREDNISONE 10 MG TABLET
ORAL_TABLET | ORAL | 0 refills | 0.00000 days | Status: CP
Start: 2022-12-11 — End: ?

## 2022-12-11 MED ORDER — DOXYCYCLINE HYCLATE 100 MG CAPSULE
ORAL_CAPSULE | Freq: Two times a day (BID) | ORAL | 2 refills | 30.00000 days | Status: CP
Start: 2022-12-11 — End: ?

## 2023-02-25 ENCOUNTER — Ambulatory Visit: Admit: 2023-02-25 | Discharge: 2023-02-26 | Payer: PRIVATE HEALTH INSURANCE

## 2023-02-25 DIAGNOSIS — Z79899 Other long term (current) drug therapy: Principal | ICD-10-CM

## 2023-02-25 DIAGNOSIS — L732 Hidradenitis suppurativa: Principal | ICD-10-CM

## 2023-02-25 MED ORDER — ACITRETIN 25 MG CAPSULE
ORAL_CAPSULE | Freq: Every day | ORAL | 3 refills | 90.00000 days | Status: CP
Start: 2023-02-25 — End: 2024-02-25

## 2023-02-25 MED ORDER — AMOXICILLIN 875 MG-POTASSIUM CLAVULANATE 125 MG TABLET
ORAL_TABLET | 3 refills | 0.00000 days | Status: CP
Start: 2023-02-25 — End: ?

## 2023-02-25 MED ORDER — PREDNISONE 10 MG TABLET
ORAL_TABLET | ORAL | 0 refills | 0.00000 days | Status: CP
Start: 2023-02-25 — End: ?

## 2023-04-22 ENCOUNTER — Ambulatory Visit: Admit: 2023-04-22 | Discharge: 2023-04-23 | Payer: PRIVATE HEALTH INSURANCE

## 2023-04-22 DIAGNOSIS — J40 Bronchitis, not specified as acute or chronic: Principal | ICD-10-CM

## 2023-04-22 DIAGNOSIS — J309 Allergic rhinitis, unspecified: Principal | ICD-10-CM

## 2023-04-22 DIAGNOSIS — Z1331 Encounter for screening for depression: Principal | ICD-10-CM

## 2023-04-22 DIAGNOSIS — R0981 Nasal congestion: Principal | ICD-10-CM

## 2023-04-22 DIAGNOSIS — J329 Chronic sinusitis, unspecified: Principal | ICD-10-CM

## 2023-04-22 MED ORDER — AZELASTINE 137 MCG (0.1 %) NASAL SPRAY
Freq: Two times a day (BID) | NASAL | 0 refills | 55 days | Status: CP
Start: 2023-04-22 — End: 2023-05-22

## 2023-04-22 MED ORDER — BENZONATATE 200 MG CAPSULE
ORAL_CAPSULE | Freq: Three times a day (TID) | ORAL | 0 refills | 10 days | Status: CP | PRN
Start: 2023-04-22 — End: ?

## 2023-04-22 MED ORDER — CEFDINIR 300 MG CAPSULE
ORAL_CAPSULE | Freq: Two times a day (BID) | ORAL | 0 refills | 7 days | Status: CP
Start: 2023-04-22 — End: 2023-04-29

## 2023-05-14 DIAGNOSIS — J40 Bronchitis, not specified as acute or chronic: Principal | ICD-10-CM

## 2023-05-14 DIAGNOSIS — J329 Chronic sinusitis, unspecified: Principal | ICD-10-CM

## 2023-05-14 DIAGNOSIS — J309 Allergic rhinitis, unspecified: Principal | ICD-10-CM

## 2023-05-14 MED ORDER — AZELASTINE 137 MCG (0.1 %) NASAL SPRAY
Freq: Two times a day (BID) | NASAL | 1 refills | 0 days
Start: 2023-05-14 — End: 2023-06-13

## 2023-06-30 ENCOUNTER — Ambulatory Visit: Admit: 2023-06-30 | Discharge: 2023-07-01 | Payer: BLUE CROSS/BLUE SHIELD

## 2023-06-30 DIAGNOSIS — L732 Hidradenitis suppurativa: Principal | ICD-10-CM

## 2023-06-30 MED ORDER — ACITRETIN 25 MG CAPSULE
ORAL_CAPSULE | Freq: Every day | ORAL | 3 refills | 90.00 days | Status: CP
Start: 2023-06-30 — End: 2024-06-29

## 2023-06-30 MED ORDER — AMOXICILLIN 875 MG-POTASSIUM CLAVULANATE 125 MG TABLET
ORAL_TABLET | 3 refills | 0.00 days | Status: CP
Start: 2023-06-30 — End: ?

## 2023-08-18 ENCOUNTER — Ambulatory Visit: Admit: 2023-08-18 | Discharge: 2023-08-19 | Payer: BLUE CROSS/BLUE SHIELD

## 2023-08-18 DIAGNOSIS — L732 Hidradenitis suppurativa: Principal | ICD-10-CM

## 2023-08-18 MED ORDER — PREDNISONE 20 MG TABLET
ORAL_TABLET | 0 refills | 0.00 days | Status: CP
Start: 2023-08-18 — End: ?

## 2023-08-18 MED ORDER — AMOXICILLIN 875 MG-POTASSIUM CLAVULANATE 125 MG TABLET
ORAL_TABLET | 3 refills | 0.00 days | Status: CP
Start: 2023-08-18 — End: ?

## 2023-12-22 ENCOUNTER — Ambulatory Visit: Admit: 2023-12-22 | Discharge: 2023-12-23 | Payer: BLUE CROSS/BLUE SHIELD

## 2023-12-22 DIAGNOSIS — Z79899 Other long term (current) drug therapy: Principal | ICD-10-CM

## 2023-12-22 DIAGNOSIS — L732 Hidradenitis suppurativa: Principal | ICD-10-CM

## 2023-12-22 MED ORDER — RINVOQ 15 MG TABLET,EXTENDED RELEASE
ORAL_TABLET | Freq: Every day | ORAL | 11 refills | 0.00000 days | Status: CP
Start: 2023-12-22 — End: ?
